# Patient Record
Sex: Male | Born: 1945 | Race: White | Hispanic: No | Marital: Married | Smoking: Former smoker
Health system: Southern US, Community
[De-identification: ages and names within clinical notes are randomized; demographics above are authoritative.]

## PROBLEM LIST (undated history)

## (undated) DIAGNOSIS — J42 Unspecified chronic bronchitis: Secondary | ICD-10-CM

## (undated) DIAGNOSIS — Z860101 Personal history of adenomatous and serrated colon polyps: Secondary | ICD-10-CM

## (undated) DIAGNOSIS — E7800 Pure hypercholesterolemia, unspecified: Secondary | ICD-10-CM

## (undated) DIAGNOSIS — I10 Essential (primary) hypertension: Secondary | ICD-10-CM

## (undated) DIAGNOSIS — R7303 Prediabetes: Secondary | ICD-10-CM

## (undated) HISTORY — PX: CARDIAC CATHETERIZATION: SHX172

---

## 2006-01-28 ENCOUNTER — Ambulatory Visit: Payer: Self-pay | Admitting: Unknown Physician Specialty

## 2006-05-13 ENCOUNTER — Ambulatory Visit: Payer: Self-pay | Admitting: Unknown Physician Specialty

## 2007-07-28 ENCOUNTER — Encounter: Payer: Self-pay | Admitting: Rheumatology

## 2007-08-04 ENCOUNTER — Encounter: Payer: Self-pay | Admitting: Rheumatology

## 2014-05-03 ENCOUNTER — Ambulatory Visit: Payer: Self-pay | Admitting: Unknown Physician Specialty

## 2014-05-06 LAB — PATHOLOGY REPORT

## 2018-08-11 ENCOUNTER — Ambulatory Visit: Payer: Medicare HMO | Admitting: Anesthesiology

## 2018-08-11 ENCOUNTER — Encounter: Payer: Self-pay | Admitting: *Deleted

## 2018-08-11 ENCOUNTER — Encounter
Admission: RE | Disposition: A | Discharge status: Home / Self Care | Payer: Self-pay | Source: Ambulatory Visit | Attending: Unknown Physician Specialty

## 2018-08-11 ENCOUNTER — Ambulatory Visit
Admission: RE | Admit: 2018-08-11 | Discharge: 2018-08-11 | Disposition: A | Discharge status: Home / Self Care | Payer: Medicare HMO | Source: Ambulatory Visit | Attending: Unknown Physician Specialty | Admitting: Unknown Physician Specialty

## 2018-08-11 DIAGNOSIS — Z86010 Personal history of colonic polyps: Secondary | ICD-10-CM | POA: Insufficient documentation

## 2018-08-11 DIAGNOSIS — Z85038 Personal history of other malignant neoplasm of large intestine: Secondary | ICD-10-CM | POA: Diagnosis not present

## 2018-08-11 DIAGNOSIS — K640 First degree hemorrhoids: Secondary | ICD-10-CM | POA: Insufficient documentation

## 2018-08-11 DIAGNOSIS — Z1211 Encounter for screening for malignant neoplasm of colon: Secondary | ICD-10-CM | POA: Diagnosis present

## 2018-08-11 DIAGNOSIS — Z87891 Personal history of nicotine dependence: Secondary | ICD-10-CM | POA: Insufficient documentation

## 2018-08-11 HISTORY — PX: COLONOSCOPY WITH PROPOFOL: SHX5780

## 2018-08-11 SURGERY — COLONOSCOPY WITH PROPOFOL
Anesthesia: General

## 2018-08-11 MED ORDER — FENTANYL CITRATE (PF) 100 MCG/2ML IJ SOLN
INTRAMUSCULAR | Status: DC | PRN
  Administered 2018-08-11 (×2): 50 ug via INTRAVENOUS

## 2018-08-11 MED ORDER — SODIUM CHLORIDE 0.9 % IV SOLN
INTRAVENOUS | Status: DC
  Administered 2018-08-11: 14:00:00 via INTRAVENOUS

## 2018-08-11 MED ORDER — SODIUM CHLORIDE 0.9 % IV SOLN: INTRAVENOUS | Status: DC

## 2018-08-11 MED ORDER — PROPOFOL 10 MG/ML IV BOLUS
INTRAVENOUS | Status: DC | PRN
  Administered 2018-08-11: 20 mg via INTRAVENOUS
  Administered 2018-08-11: 10 mg via INTRAVENOUS
  Administered 2018-08-11: 20 mg via INTRAVENOUS

## 2018-08-11 MED ORDER — MIDAZOLAM HCL 2 MG/2ML IJ SOLN
INTRAMUSCULAR | Status: DC | PRN
  Administered 2018-08-11 (×2): 1 mg via INTRAVENOUS

## 2018-08-11 MED ORDER — EPHEDRINE SULFATE 50 MG/ML IJ SOLN
INTRAMUSCULAR | Status: DC | PRN
  Administered 2018-08-11 (×2): 10 mg via INTRAVENOUS

## 2018-08-11 MED ORDER — FENTANYL CITRATE (PF) 100 MCG/2ML IJ SOLN
INTRAMUSCULAR | Status: AC
  Filled 2018-08-11: qty 2

## 2018-08-11 MED ORDER — LIDOCAINE HCL (CARDIAC) PF 100 MG/5ML IV SOSY
PREFILLED_SYRINGE | INTRAVENOUS | Status: DC | PRN
  Administered 2018-08-11: 80 mg via INTRATRACHEAL

## 2018-08-11 MED ORDER — LIDOCAINE HCL (PF) 2 % IJ SOLN
INTRAMUSCULAR | Status: AC
  Filled 2018-08-11: qty 10

## 2018-08-11 MED ORDER — PROPOFOL 500 MG/50ML IV EMUL
INTRAVENOUS | Status: DC | PRN
  Administered 2018-08-11: 50 ug/kg/min via INTRAVENOUS

## 2018-08-11 MED ORDER — MIDAZOLAM HCL 2 MG/2ML IJ SOLN
INTRAMUSCULAR | Status: AC
  Filled 2018-08-11: qty 2

## 2018-08-11 MED ORDER — PROPOFOL 10 MG/ML IV BOLUS
INTRAVENOUS | Status: AC
  Filled 2018-08-11: qty 20

## 2018-08-11 NOTE — Anesthesia Preprocedure Evaluation (Addendum)
Anesthesia Evaluation  Patient identified by MRN, date of birth, ID band Patient awake    Reviewed: Allergy & Precautions, H&P , NPO status , reviewed documented beta blocker date and time   Airway Mallampati: II  TM Distance: >3 FB Neck ROM: full    Dental  (+) Caps   Pulmonary former smoker,    Pulmonary exam normal        Cardiovascular Normal cardiovascular exam     Neuro/Psych    GI/Hepatic neg GERD  ,  Endo/Other    Renal/GU      Musculoskeletal   Abdominal   Peds  Hematology   Anesthesia Other Findings History reviewed. No pertinent past medical history.  Surgical history: Colonoscopy w hx polyps.    BMI    Body Mass Index:  23.01 kg/m      Reproductive/Obstetrics                            Anesthesia Physical Anesthesia Plan  ASA: I  Anesthesia Plan: General   Post-op Pain Management:    Induction: Intravenous  PONV Risk Score and Plan: 2 and Treatment may vary due to age or medical condition and TIVA  Airway Management Planned: Nasal Cannula and Natural Airway  Additional Equipment:   Intra-op Plan:   Post-operative Plan:   Informed Consent: I have reviewed the patients History and Physical, chart, labs and discussed the procedure including the risks, benefits and alternatives for the proposed anesthesia with the patient or authorized representative who has indicated his/her understanding and acceptance.   Dental Advisory Given  Plan Discussed with: CRNA  Anesthesia Plan Comments:        Anesthesia Quick Evaluation

## 2018-08-11 NOTE — Anesthesia Post-op Follow-up Note (Signed)
Anesthesia QCDR form completed.        

## 2018-08-11 NOTE — Op Note (Signed)
[facility] Gastroenterology Patient Name: Donald Barker Procedure Date: 08/11/2018 2:32 PM MRN: [id number] Account #: [id number] Date of Birth: 03/25/1946 Admit Type: Outpatient Age: 72 Room: [facility] ENDO ROOM 1 Gender: Male Note Status: Finalized Procedure:            Colonoscopy Indications:          High risk colon cancer surveillance: Personal history                        of colon cancer Providers:            Scot Jun, MD Referring MD:         No Local Md, MD (Referring MD) Medicines:            Propofol per Anesthesia Complications:        No immediate complications. Procedure:            Pre-Anesthesia Assessment:                       - After reviewing the risks and benefits, the patient                        was deemed in satisfactory condition to undergo the                        procedure.                       After obtaining informed consent, the colonoscope was                        passed under direct vision. Throughout the procedure,                        the patient's blood pressure, pulse, and oxygen                        saturations were monitored continuously. The                        Colonoscope was introduced through the anus and                        advanced to the the cecum,( picture not taken)                        identified by appendiceal orifice and ileocecal valve.                        The colonoscopy was performed without difficulty. The                        patient tolerated the procedure well. The quality of                        the bowel preparation was good. Findings:      Internal hemorrhoids were found during endoscopy. The hemorrhoids were       small and Grade I (internal hemorrhoids that do not prolapse).      The exam was otherwise without abnormality. Dye injections done from  previous procedure noted. No other abnormality seen. Impression:           - Internal hemorrhoids.   - The examination was otherwise normal.                       - No specimens collected. Recommendation:       - Repeat colonoscopy in 5 years for screening purposes. Scot Jun, MD 08/11/2018 4:24:53 PM This report has been signed electronically. Number of Addenda: 0 Note Initiated On: 08/11/2018 2:32 PM Scope Withdrawal Time: 0 hours 10 minutes 35 seconds  Total Procedure Duration: 0 hours 18 minutes 39 seconds       [facility]

## 2018-08-11 NOTE — Anesthesia Postprocedure Evaluation (Signed)
Anesthesia Post Note  Patient: Donald Barker.  Procedure(s) Performed: COLONOSCOPY WITH PROPOFOL (N/A )  Patient location during evaluation: Endoscopy Anesthesia Type: General Level of consciousness: awake and alert Pain management: pain level controlled Vital Signs Assessment: post-procedure vital signs reviewed and stable Respiratory status: spontaneous breathing, nonlabored ventilation, respiratory function stable and patient connected to nasal cannula oxygen Cardiovascular status: blood pressure returned to baseline and stable Postop Assessment: no apparent nausea or vomiting Anesthetic complications: no     Last Vitals:  Vitals:   08/11/18 1633 08/11/18 1643  BP: (!) 118/57 120/61  Pulse: 67 64  Resp: 16 17  Temp:    SpO2: 99% 98%    Last Pain:  Vitals:   08/11/18 1643  TempSrc:   PainSc: 0-No pain                 [name] [name]

## 2018-08-11 NOTE — H&P (Signed)
Primary Care Physician:  Lauro Regulus, MD Primary Gastroenterologist:  Dr. Mechele Collin  Pre-Procedure History & Physical: HPI:  Donald Barker. is a 72 y.o. male is here for an colonoscopy.Due to history of colon cancer in 2007.Patient had colon cancer inside of a polyp and It was removed.   History reviewed. No pertinent past medical history.  History reviewed. No pertinent surgical history.  Prior to Admission medications   Not on File    Allergies as of 05/20/2018  . (Not on File)    History reviewed. No pertinent family history.  Social History   Socioeconomic History  . Marital status: Married    Spouse name: Not on file  . Number of children: Not on file  . Years of education: Not on file  . Highest education level: Not on file  Occupational History  . Not on file  Social Needs  . Financial resource strain: Not on file  . Food insecurity:    Worry: Not on file    Inability: Not on file  . Transportation needs:    Medical: Not on file    Non-medical: Not on file  Tobacco Use  . Smoking status: Former Smoker    Last attempt to quit: 08/11/1984    Years since quitting: 34.0  . Smokeless tobacco: Never Used  Substance and Sexual Activity  . Alcohol use: Yes    Alcohol/week: 2.0 standard drinks    Types: 1 Cans of beer, 1 Glasses of wine per week  . Drug use: Never  . Sexual activity: Not on file  Lifestyle  . Physical activity:    Days per week: Not on file    Minutes per session: Not on file  . Stress: Not on file  Relationships  . Social connections:    Talks on phone: Not on file    Gets together: Not on file    Attends religious service: Not on file    Active member of club or organization: Not on file    Attends meetings of clubs or organizations: Not on file    Relationship status: Not on file  . Intimate partner violence:    Fear of current or ex partner: Not on file    Emotionally abused: Not on file    Physically abused: Not on file     Forced sexual activity: Not on file  Other Topics Concern  . Not on file  Social History Narrative  . Not on file    Review of Systems: See HPI, otherwise negative ROS  Physical Exam: BP 139/82   Pulse 73   Temp (!) 96.6 F (35.9 C) (Tympanic)   Resp 16   Ht 5\' 11"  (1.803 m)   Wt 74.8 kg   SpO2 99%   BMI 23.01 kg/m  General:   Alert,  pleasant and cooperative in NAD Head:  Normocephalic and atraumatic. Neck:  Supple; no masses or thyromegaly. Lungs:  Clear throughout to auscultation.    Heart:  Regular rate and rhythm. Abdomen:  Soft, nontender and nondistended. Normal bowel sounds, without guarding, and without rebound.   Neurologic:  Alert and  oriented x4;  grossly normal neurologically.  Impression/Plan: [name]. is here for an colonoscopy to be performed for [facility] colon cancer and colon polyps..  Risks, benefits, limitations, and alternatives regarding  colonoscopy have been reviewed with the patient.  Questions have been answered.  All parties agreeable.   [name], MD  08/11/2018, 2:18 PM

## 2018-08-11 NOTE — Transfer of Care (Signed)
Immediate Anesthesia Transfer of Care Note  Patient: Donald Barker.  Procedure(s) Performed: COLONOSCOPY WITH PROPOFOL (N/A )  Patient Location: PACU  Anesthesia Type:MAC  Level of Consciousness: awake and alert   Airway & Oxygen Therapy: Patient Spontanous Breathing and Patient connected to nasal cannula oxygen  Post-op Assessment: Report given to RN and Post -op Vital signs reviewed and stable  Post vital signs: Reviewed and stable  Last Vitals:  Vitals Value Taken Time  BP    Temp    Pulse    Resp    SpO2      Last Pain:  Vitals:   08/11/18 1409  TempSrc: Tympanic  PainSc: 0-No pain         Complications: No apparent anesthesia complications

## 2018-08-12 ENCOUNTER — Encounter: Payer: Self-pay | Admitting: Unknown Physician Specialty

## 2020-01-05 ENCOUNTER — Ambulatory Visit: Payer: Medicare Other | Attending: Internal Medicine

## 2020-01-05 DIAGNOSIS — Z20822 Contact with and (suspected) exposure to covid-19: Secondary | ICD-10-CM

## 2020-01-06 ENCOUNTER — Other Ambulatory Visit: Payer: Self-pay | Admitting: Internal Medicine

## 2020-01-06 DIAGNOSIS — U071 COVID-19: Secondary | ICD-10-CM

## 2020-01-06 LAB — NOVEL CORONAVIRUS, NAA: SARS-CoV-2, NAA: DETECTED — AB

## 2020-01-06 NOTE — Progress Notes (Signed)
  I connected by phone with Donald Barker. on 01/06/2020 at 3:28 PM to discuss the potential use of an new treatment for mild to moderate COVID-19 viral infection in non-hospitalized patients.  This patient is a 74 y.o. male that meets the FDA criteria for Emergency Use Authorization of bamlanivimab or casirivimab\imdevimab.  Has a (+) direct SARS-CoV-2 viral test result  Has mild or moderate COVID-19   Is ? 74 years of age and weighs ? 40 kg  Is NOT hospitalized due to COVID-19  Is NOT requiring oxygen therapy or requiring an increase in baseline oxygen flow rate due to COVID-19  Is within 10 days of symptom onset  Has at least one of the high risk factor(s) for progression to severe COVID-19 and/or hospitalization as defined in EUA.  Specific high risk criteria : >/= 74 yo   I have spoken and communicated the following to the patient or parent/caregiver:  1. FDA has authorized the emergency use of bamlanivimab and casirivimab\imdevimab for the treatment of mild to moderate COVID-19 in adults and pediatric patients with positive results of direct SARS-CoV-2 viral testing who are 43 years of age and older weighing at least 40 kg, and who are at high risk for progressing to severe COVID-19 and/or hospitalization.  2. The significant known and potential risks and benefits of bamlanivimab and casirivimab\imdevimab, and the extent to which such potential risks and benefits are unknown.  3. Information on available alternative treatments and the risks and benefits of those alternatives, including clinical trials.  4. Patients treated with bamlanivimab and casirivimab\imdevimab should continue to self-isolate and use infection control measures (e.g., wear mask, isolate, social distance, avoid sharing personal items, clean and disinfect "high touch" surfaces, and frequent handwashing) according to CDC guidelines.   5. The patient or parent/caregiver has the option to accept or refuse  bamlanivimab or casirivimab\imdevimab .  After reviewing this information with the patient, The patient agreed to proceed with receiving the bamlanimivab infusion and will be provided a copy of the Fact sheet prior to receiving the infusion.   Appointment for infusion scheduled for 2/4 at 10:30am.  Cyndee Brightly, NP-C [facility]  pgr [phone]

## 2020-01-07 ENCOUNTER — Telehealth (HOSPITAL_COMMUNITY): Payer: Self-pay | Admitting: Nurse Practitioner

## 2020-01-07 ENCOUNTER — Ambulatory Visit (HOSPITAL_COMMUNITY)
Admission: RE | Admit: 2020-01-07 | Discharge: 2020-01-07 | Disposition: A | Discharge status: Home / Self Care | Payer: Medicare Other | Source: Ambulatory Visit | Attending: Pulmonary Disease | Admitting: Pulmonary Disease

## 2020-01-07 ENCOUNTER — Encounter (HOSPITAL_COMMUNITY): Payer: Self-pay

## 2020-01-07 DIAGNOSIS — U071 COVID-19: Secondary | ICD-10-CM | POA: Diagnosis present

## 2020-01-07 DIAGNOSIS — Z23 Encounter for immunization: Secondary | ICD-10-CM | POA: Diagnosis not present

## 2020-01-07 MED ORDER — EPINEPHRINE 0.3 MG/0.3ML IJ SOAJ: 0.3000 mg | Freq: Once | INTRAMUSCULAR | Status: DC | PRN

## 2020-01-07 MED ORDER — SODIUM CHLORIDE 0.9 % IV SOLN
700.0000 mg | Freq: Once | INTRAVENOUS | Status: AC
  Administered 2020-01-07: 700 mg via INTRAVENOUS
  Filled 2020-01-07: qty 20

## 2020-01-07 MED ORDER — DIPHENHYDRAMINE HCL 50 MG/ML IJ SOLN: 50.0000 mg | Freq: Once | INTRAMUSCULAR | Status: DC | PRN

## 2020-01-07 MED ORDER — SODIUM CHLORIDE 0.9 % IV SOLN: INTRAVENOUS | Status: DC | PRN

## 2020-01-07 MED ORDER — ALBUTEROL SULFATE HFA 108 (90 BASE) MCG/ACT IN AERS: 2.0000 | INHALATION_SPRAY | Freq: Once | RESPIRATORY_TRACT | Status: DC | PRN

## 2020-01-07 MED ORDER — METHYLPREDNISOLONE SODIUM SUCC 125 MG IJ SOLR: 125.0000 mg | Freq: Once | INTRAMUSCULAR | Status: DC | PRN

## 2020-01-07 MED ORDER — FAMOTIDINE IN NACL 20-0.9 MG/50ML-% IV SOLN: 20.0000 mg | Freq: Once | INTRAVENOUS | Status: DC | PRN

## 2020-01-07 NOTE — Progress Notes (Signed)
  Diagnosis: COVID-19  Physician:Dr Wright  Procedure: [facility] Med: bamlanivimab infusion - Provided patient with bamlanimivab fact sheet for patients, parents and caregivers prior to infusion.  Complications: No immediate complications noted.  Discharge: Discharged home   Deidrea Gaetz W 01/07/2020  

## 2020-01-07 NOTE — Discharge Instructions (Signed)

## 2020-01-07 NOTE — Telephone Encounter (Signed)
Called patient to discuss MAB treatment based on covid positive test. He had just received infusion and is feeling well.

## 2020-06-07 ENCOUNTER — Other Ambulatory Visit: Payer: Self-pay | Admitting: Sports Medicine

## 2020-06-07 DIAGNOSIS — M25561 Pain in right knee: Secondary | ICD-10-CM

## 2020-06-07 DIAGNOSIS — M25461 Effusion, right knee: Secondary | ICD-10-CM

## 2020-06-21 ENCOUNTER — Ambulatory Visit
Admission: RE | Admit: 2020-06-21 | Discharge: 2020-06-21 | Disposition: A | Discharge status: Home / Self Care | Payer: Medicare HMO | Source: Ambulatory Visit | Attending: Sports Medicine | Admitting: Sports Medicine

## 2020-06-21 ENCOUNTER — Other Ambulatory Visit: Payer: Self-pay

## 2020-06-21 DIAGNOSIS — M25461 Effusion, right knee: Secondary | ICD-10-CM | POA: Insufficient documentation

## 2020-06-21 DIAGNOSIS — M25561 Pain in right knee: Secondary | ICD-10-CM | POA: Diagnosis present

## 2020-07-04 ENCOUNTER — Other Ambulatory Visit: Payer: Self-pay | Admitting: Orthopedic Surgery

## 2020-07-11 ENCOUNTER — Other Ambulatory Visit: Payer: Self-pay

## 2020-07-11 ENCOUNTER — Encounter
Admission: RE | Admit: 2020-07-11 | Discharge: 2020-07-11 | Disposition: A | Discharge status: Home / Self Care | Payer: Medicare HMO | Source: Ambulatory Visit | Attending: Orthopedic Surgery | Admitting: Orthopedic Surgery

## 2020-07-11 NOTE — Patient Instructions (Addendum)
Your procedure is scheduled on: 07/15/20 Report to [place]. To find out your arrival time please call [phone] between 1PM - 3PM on 07/14/20.  Remember: Instructions that are not followed completely may result in serious medical risk, up to and including death, or upon the discretion of your surgeon and anesthesiologist your surgery may need to be rescheduled.     _X__ 1. Do not eat food after midnight the night before your procedure.                 No gum chewing or hard candies. You may drink clear liquids up to 2 hours                 before you are scheduled to arrive for your surgery- DO not drink clear                 liquids within 2 hours of the start of your surgery.                 Clear Liquids include:  water, apple juice without pulp, clear carbohydrate                 drink such as Clearfast or Gatorade, Black Coffee or Tea (Do not add                 anything to coffee or tea). Diabetics water only  __X__2.  On the morning of surgery brush your teeth with toothpaste and water, you                 may rinse your mouth with mouthwash if you wish.  Do not swallow any              toothpaste of mouthwash.     _X__ 3.  No Alcohol for 24 hours before or after surgery.   _X__ 4.  Do Not Smoke or use e-cigarettes For 24 Hours Prior to Your Surgery.                 Do not use any chewable tobacco products for at least 6 hours prior to                 surgery.  ____  5.  Bring all medications with you on the day of surgery if instructed.   __X__  6.  Notify your doctor if there is any change in your medical condition      (cold, fever, infections).     Do not wear jewelry, make-up, hairpins, clips or nail polish. Do not wear lotions, powders, or perfumes.  Do not shave 48 hours prior to surgery. Men may shave face and neck. Do not bring valuables to the hospital.    [facility] is not responsible for any belongings or  valuables.  Contacts, dentures/partials or body piercings may not be worn into surgery. Bring a case for your contacts, glasses or hearing aids, a denture cup will be supplied. Leave your suitcase in the car. After surgery it may be brought to your room. For patients admitted to the hospital, discharge time is determined by your treatment team.   Patients discharged the day of surgery will not be allowed to drive home.   Please read over the following fact sheets that you were given:   MRSA Information  __X__ Take these medicines the morning of surgery with A SIP OF WATER:  1. none  2.   3.   4.  5.  6.  ____ Fleet Enema (as directed)   __X__ Use CHG Soap/SAGE wipes as directed  ____ Use inhalers on the day of surgery  ____ Stop metformin/Janumet/Farxiga 2 days prior to surgery    ____ Take 1/2 of usual insulin dose the night before surgery. No insulin the morning          of surgery.   ____ Stop Blood Thinners Coumadin/Plavix/Xarelto/Pleta/Pradaxa/Eliquis/Effient/Aspirin  on   Or contact your Surgeon, Cardiologist or Medical Doctor regarding  ability to stop your blood thinners  __X__ Stop Anti-inflammatories 7 days before surgery such as Advil, Ibuprofen, Motrin,  BC or Goodies Powder, Naprosyn, Naproxen, Aleve, Aspirin    __X__ Stop all herbal supplements, fish oil or vitamin E until after surgery.    ____ Bring C-Pap to the hospital.    Ensure pre-surgery drink to be finished 2 hours before arrival.   How to Use an Incentive Spirometer An incentive spirometer is a tool that measures how well you are filling your lungs with each breath. Learning to take long, deep breaths using this tool can help you keep your lungs clear and active. This may help to reverse or lessen your chance of developing breathing (pulmonary) problems, especially infection. You may be asked to use a spirometer:  After a surgery.  If you have a lung problem or a history of smoking.  After  a long period of time when you have been unable to move or be active. If the spirometer includes an indicator to show the highest number that you have reached, your health care provider or respiratory therapist will help you set a goal. Keep a list (log) of your progress as told by your health care provider. What are the risks?  Breathing too quickly may cause dizziness or cause you to pass out. Take your time so you do not get dizzy or light-headed.  If you are in pain, you may need to take pain medicine before doing incentive spirometry. It is harder to take a deep breath if you are having pain. How to use your incentive spirometer  1. Sit up on the edge of your bed or on a chair. 2. Hold the incentive spirometer so that it is in an upright position. 3. Before you use the spirometer, breathe out normally. 4. Place the mouthpiece in your mouth. Make sure your lips are closed tightly around it. 5. Breathe in slowly and as deeply as you can through your mouth, causing the piston or the ball to rise toward the top of the chamber. 6. Hold your breath for 3-5 seconds, or for as long as possible. ? If the spirometer includes a coach indicator, use this to guide you in breathing. Slow down your breathing if the indicator goes above the marked areas. 7. Remove the mouthpiece from your mouth and breathe out normally. The piston or ball will return to the bottom of the chamber. 8. Rest for a few seconds, then repeat the steps 10 or more times. ? Take your time and take a few normal breaths between deep breaths so that you do not get dizzy or light-headed. ? Do this every 1-2 hours when you are awake. 9. If the spirometer includes a goal marker to show the highest number you have reached (best effort), use this as a goal to work toward during each repetition. 10. After each set of 10 deep breaths, cough a few times.  This will help to make sure that your lungs are clear. ? If you have an incision on your  chest or abdomen from surgery, place a pillow or a rolled-up towel firmly against the incision when you cough. This can help to reduce pain from coughing. General tips  When you become able to get out of bed, walk around often and continue to cough to help clear your lungs.  Keep using the incentive spirometer until your health care provider says it is okay to stop using it. If you have been in the hospital, you may be told to keep using the spirometer at home. Contact a health care provider if:  You are having difficulty using the spirometer.  You have trouble using the spirometer as often as instructed.  Your pain medicine is not giving enough relief for you to use the spirometer as told.  You have a fever.  You develop shortness of breath. Get help right away if:  You develop a cough with bloody mucus from the lungs (bloody sputum).  You have fluid or blood coming from an incision site after you cough. Summary  An incentive spirometer is a tool that can help you learn to take long, deep breaths to keep your lungs clear and active.  You may be asked to use a spirometer after a surgery, if you have a lung problem or a history of smoking, or if you have been inactive for a long period of time.  Use your incentive spirometer as instructed every 1-2 hours while you are awake.  If you have an incision on your chest or abdomen, place a pillow or a rolled-up towel firmly against your incision when you cough. This will help to reduce pain. This information is not intended to replace advice given to you by your health care provider. Make sure you discuss any questions you have with your health care provider. Document Revised: 06/19/2019 Document Reviewed: 10/02/2017 Elsevier Patient Education  2020 [employer].

## 2020-07-13 ENCOUNTER — Other Ambulatory Visit
Admission: RE | Admit: 2020-07-13 | Discharge: 2020-07-13 | Disposition: A | Discharge status: Home / Self Care | Payer: Medicare HMO | Source: Ambulatory Visit | Attending: Orthopedic Surgery | Admitting: Orthopedic Surgery

## 2020-07-13 ENCOUNTER — Other Ambulatory Visit: Payer: Self-pay

## 2020-07-13 ENCOUNTER — Other Ambulatory Visit: Payer: Medicare HMO

## 2020-07-13 DIAGNOSIS — Z20822 Contact with and (suspected) exposure to covid-19: Secondary | ICD-10-CM | POA: Insufficient documentation

## 2020-07-13 DIAGNOSIS — Z01818 Encounter for other preprocedural examination: Secondary | ICD-10-CM | POA: Diagnosis not present

## 2020-07-13 DIAGNOSIS — Z01810 Encounter for preprocedural cardiovascular examination: Secondary | ICD-10-CM

## 2020-07-13 LAB — SARS CORONAVIRUS 2 (TAT 6-24 HRS): SARS Coronavirus 2: NEGATIVE

## 2020-07-15 ENCOUNTER — Other Ambulatory Visit: Payer: Self-pay

## 2020-07-15 ENCOUNTER — Ambulatory Visit: Payer: Medicare HMO | Admitting: Anesthesiology

## 2020-07-15 ENCOUNTER — Ambulatory Visit
Admission: RE | Admit: 2020-07-15 | Discharge: 2020-07-15 | Disposition: A | Discharge status: Home / Self Care | Payer: Medicare HMO | Attending: Orthopedic Surgery | Admitting: Orthopedic Surgery

## 2020-07-15 ENCOUNTER — Encounter
Admission: RE | Disposition: A | Discharge status: Home / Self Care | Payer: Self-pay | Source: Home / Self Care | Attending: Orthopedic Surgery

## 2020-07-15 ENCOUNTER — Encounter: Payer: Self-pay | Admitting: Orthopedic Surgery

## 2020-07-15 DIAGNOSIS — Z85038 Personal history of other malignant neoplasm of large intestine: Secondary | ICD-10-CM | POA: Insufficient documentation

## 2020-07-15 DIAGNOSIS — E7800 Pure hypercholesterolemia, unspecified: Secondary | ICD-10-CM | POA: Insufficient documentation

## 2020-07-15 DIAGNOSIS — Z87891 Personal history of nicotine dependence: Secondary | ICD-10-CM | POA: Diagnosis not present

## 2020-07-15 DIAGNOSIS — Z86010 Personal history of colonic polyps: Secondary | ICD-10-CM | POA: Insufficient documentation

## 2020-07-15 DIAGNOSIS — X58XXXA Exposure to other specified factors, initial encounter: Secondary | ICD-10-CM | POA: Diagnosis not present

## 2020-07-15 DIAGNOSIS — S83241A Other tear of medial meniscus, current injury, right knee, initial encounter: Secondary | ICD-10-CM | POA: Diagnosis not present

## 2020-07-15 HISTORY — PX: KNEE ARTHROSCOPY WITH MEDIAL MENISECTOMY: SHX5651

## 2020-07-15 SURGERY — ARTHROSCOPY, KNEE, WITH MEDIAL MENISCECTOMY
Anesthesia: General | Site: Knee | Laterality: Right

## 2020-07-15 MED ORDER — LACTATED RINGERS IV SOLN: INTRAVENOUS | Status: DC

## 2020-07-15 MED ORDER — OXYCODONE HCL 5 MG PO TABS: 5.0000 mg | ORAL_TABLET | Freq: Once | ORAL | Status: DC | PRN

## 2020-07-15 MED ORDER — FAMOTIDINE 20 MG PO TABS: 20.0000 mg | ORAL_TABLET | Freq: Once | ORAL | Status: AC

## 2020-07-15 MED ORDER — CHLORHEXIDINE GLUCONATE 0.12 % MT SOLN: 15.0000 mL | Freq: Once | OROMUCOSAL | Status: AC

## 2020-07-15 MED ORDER — CEFAZOLIN SODIUM-DEXTROSE 2-4 GM/100ML-% IV SOLN
INTRAVENOUS | Status: AC
  Filled 2020-07-15: qty 100

## 2020-07-15 MED ORDER — LIDOCAINE-EPINEPHRINE 1 %-1:100000 IJ SOLN
INTRAMUSCULAR | Status: DC | PRN
  Administered 2020-07-15: 6 mL

## 2020-07-15 MED ORDER — ORAL CARE MOUTH RINSE: 15.0000 mL | Freq: Once | OROMUCOSAL | Status: AC

## 2020-07-15 MED ORDER — FAMOTIDINE 20 MG PO TABS
ORAL_TABLET | ORAL | Status: AC
  Administered 2020-07-15: 20 mg via ORAL
  Filled 2020-07-15: qty 1

## 2020-07-15 MED ORDER — PROPOFOL 10 MG/ML IV BOLUS
INTRAVENOUS | Status: DC | PRN
  Administered 2020-07-15: 150 mg via INTRAVENOUS

## 2020-07-15 MED ORDER — CEFAZOLIN SODIUM-DEXTROSE 2-4 GM/100ML-% IV SOLN
2.0000 g | INTRAVENOUS | Status: AC
  Administered 2020-07-15: 2 g via INTRAVENOUS

## 2020-07-15 MED ORDER — LACTATED RINGERS IV SOLN
INTRAVENOUS | Status: DC | PRN
  Administered 2020-07-15: 4 mL

## 2020-07-15 MED ORDER — DEXAMETHASONE SODIUM PHOSPHATE 10 MG/ML IJ SOLN
INTRAMUSCULAR | Status: DC | PRN
  Administered 2020-07-15: 5 mg via INTRAVENOUS

## 2020-07-15 MED ORDER — FENTANYL CITRATE (PF) 100 MCG/2ML IJ SOLN
INTRAMUSCULAR | Status: AC
  Filled 2020-07-15: qty 2

## 2020-07-15 MED ORDER — EPINEPHRINE PF 1 MG/ML IJ SOLN
INTRAMUSCULAR | Status: AC
  Filled 2020-07-15: qty 4

## 2020-07-15 MED ORDER — ONDANSETRON 4 MG PO TBDP
4.0000 mg | ORAL_TABLET | Freq: Three times a day (TID) | ORAL | 0 refills | Status: DC | PRN
Start: 2020-07-15 — End: 2020-12-30

## 2020-07-15 MED ORDER — PROPOFOL 10 MG/ML IV BOLUS
INTRAVENOUS | Status: AC
  Filled 2020-07-15: qty 20

## 2020-07-15 MED ORDER — BUPIVACAINE HCL (PF) 0.5 % IJ SOLN
INTRAMUSCULAR | Status: DC | PRN
  Administered 2020-07-15: 6 mL

## 2020-07-15 MED ORDER — ONDANSETRON HCL 4 MG/2ML IJ SOLN
INTRAMUSCULAR | Status: DC | PRN
  Administered 2020-07-15: 4 mg via INTRAVENOUS

## 2020-07-15 MED ORDER — FENTANYL CITRATE (PF) 100 MCG/2ML IJ SOLN
INTRAMUSCULAR | Status: DC | PRN
  Administered 2020-07-15 (×2): 25 ug via INTRAVENOUS

## 2020-07-15 MED ORDER — ACETAMINOPHEN 500 MG PO TABS
1000.0000 mg | ORAL_TABLET | Freq: Three times a day (TID) | ORAL | 2 refills | Status: DC
Start: 2020-07-15 — End: 2020-12-30

## 2020-07-15 MED ORDER — CHLORHEXIDINE GLUCONATE 0.12 % MT SOLN
OROMUCOSAL | Status: AC
  Administered 2020-07-15: 15 mL via OROMUCOSAL
  Filled 2020-07-15: qty 15

## 2020-07-15 MED ORDER — IBUPROFEN 800 MG PO TABS
800.0000 mg | ORAL_TABLET | Freq: Three times a day (TID) | ORAL | 1 refills | Status: AC
Start: 2020-07-15 — End: 2020-07-29

## 2020-07-15 MED ORDER — HYDROCODONE-ACETAMINOPHEN 5-325 MG PO TABS: 1.0000 | ORAL_TABLET | ORAL | 0 refills | Status: DC | PRN

## 2020-07-15 MED ORDER — FENTANYL CITRATE (PF) 100 MCG/2ML IJ SOLN: 25.0000 ug | INTRAMUSCULAR | Status: DC | PRN

## 2020-07-15 MED ORDER — ASPIRIN EC 325 MG PO TBEC
325.0000 mg | DELAYED_RELEASE_TABLET | Freq: Every day | ORAL | 0 refills | Status: AC
Start: 2020-07-15 — End: 2020-07-29

## 2020-07-15 MED ORDER — OXYCODONE HCL 5 MG/5ML PO SOLN: 5.0000 mg | Freq: Once | ORAL | Status: DC | PRN

## 2020-07-15 SURGICAL SUPPLY — 48 items
ADAPTER IRRIG TUBE 2 SPIKE SOL (ADAPTER) ×6 IMPLANT
ADPR TBG 2 SPK PMP STRL ASCP (ADAPTER) ×2
APL PRP STRL LF DISP 70% ISPRP (MISCELLANEOUS) ×1
BLADE SURG SZ11 CARB STEEL (BLADE) ×3 IMPLANT
BNDG COHESIVE 6X5 TAN STRL LF (GAUZE/BANDAGES/DRESSINGS) ×3 IMPLANT
BNDG ELASTIC 6X5.8 VLCR STR LF (GAUZE/BANDAGES/DRESSINGS) ×3 IMPLANT
BNDG ESMARK 6X12 TAN STRL LF (GAUZE/BANDAGES/DRESSINGS) ×3 IMPLANT
BUR RADIUS 3.5 (BURR) ×3 IMPLANT
BUR RADIUS 4.0X18.5 (BURR) ×3 IMPLANT
CAST PADDING 6X4YD ST 30248 (SOFTGOODS) ×2
CHLORAPREP W/TINT 26 (MISCELLANEOUS) ×3 IMPLANT
COOLER POLAR GLACIER W/PUMP (MISCELLANEOUS) ×3 IMPLANT
COVER WAND RF STERILE (DRAPES) ×3 IMPLANT
CUFF TOURN SGL QUICK 24 (TOURNIQUET CUFF)
CUFF TOURN SGL QUICK 30 (TOURNIQUET CUFF) ×3
CUFF TRNQT CYL 24X4X16.5-23 (TOURNIQUET CUFF) IMPLANT
CUFF TRNQT CYL 30X4X21-28X (TOURNIQUET CUFF) ×1 IMPLANT
DEVICE SUCT BLK HOLE OR FLOOR (MISCELLANEOUS) IMPLANT
DRAPE IMP U-DRAPE 54X76 (DRAPES) ×3 IMPLANT
ELECT REM PT RETURN 9FT ADLT (ELECTROSURGICAL)
ELECTRODE REM PT RTRN 9FT ADLT (ELECTROSURGICAL) IMPLANT
GAUZE SPONGE 4X4 12PLY STRL (GAUZE/BANDAGES/DRESSINGS) ×3 IMPLANT
GLOVE BIOGEL PI IND STRL 8 (GLOVE) ×1 IMPLANT
GLOVE BIOGEL PI INDICATOR 8 (GLOVE) ×2
GLOVE SURG ORTHO 8.0 STRL STRW (GLOVE) ×6 IMPLANT
GOWN STRL REUS W/ TWL LRG LVL3 (GOWN DISPOSABLE) ×1 IMPLANT
GOWN STRL REUS W/ TWL XL LVL3 (GOWN DISPOSABLE) ×1 IMPLANT
GOWN STRL REUS W/TWL LRG LVL3 (GOWN DISPOSABLE) ×3
GOWN STRL REUS W/TWL XL LVL3 (GOWN DISPOSABLE) ×3
IV LACTATED RINGER IRRG 3000ML (IV SOLUTION) ×12
IV LR IRRIG 3000ML ARTHROMATIC (IV SOLUTION) ×4 IMPLANT
KIT TURNOVER KIT A (KITS) ×3 IMPLANT
MANIFOLD NEPTUNE II (INSTRUMENTS) ×3 IMPLANT
MAT ABSORB  FLUID 56X50 GRAY (MISCELLANEOUS) ×2
MAT ABSORB FLUID 56X50 GRAY (MISCELLANEOUS) ×1 IMPLANT
PACK KNEE ARTHRO (MISCELLANEOUS) ×3 IMPLANT
PAD ABD DERMACEA PRESS 5X9 (GAUZE/BANDAGES/DRESSINGS) ×6 IMPLANT
PAD WRAPON POLAR KNEE (MISCELLANEOUS) ×1 IMPLANT
PADDING CAST COTTON 6X4 ST (SOFTGOODS) ×1 IMPLANT
PENCIL ELECTRO HAND CTR (MISCELLANEOUS) IMPLANT
SET TUBE SUCT SHAVER OUTFL 24K (TUBING) ×3 IMPLANT
SET TUBE TIP INTRA-ARTICULAR (MISCELLANEOUS) ×3 IMPLANT
SUT ETHILON 3-0 FS-10 30 BLK (SUTURE) ×3
SUTURE EHLN 3-0 FS-10 30 BLK (SUTURE) ×1 IMPLANT
TOWEL OR 17X26 4PK STRL BLUE (TOWEL DISPOSABLE) ×6 IMPLANT
TUBING ARTHRO INFLOW-ONLY STRL (TUBING) ×3 IMPLANT
WAND WEREWOLF FLOW 90D (MISCELLANEOUS) ×3 IMPLANT
WRAPON POLAR PAD KNEE (MISCELLANEOUS) ×3

## 2020-07-15 NOTE — Transfer of Care (Signed)
Immediate Anesthesia Transfer of Care Note  Patient: Donald Barker.  Procedure(s) Performed: Right knee arthroscopic partial medial meniscectomy (Right Knee)  Patient Location: PACU  Anesthesia Type:General  Level of Consciousness: awake, alert  and oriented  Airway & Oxygen Therapy: Patient Spontanous Breathing and Patient connected to face mask oxygen  Post-op Assessment: Report given to RN and Post -op Vital signs reviewed and stable  Post vital signs: Reviewed and stable  Last Vitals:  Vitals Value Taken Time  BP 124/70 07/15/20 1226  Temp 35.9 C 07/15/20 1226  Pulse 56 07/15/20 1231  Resp 13 07/15/20 1231  SpO2 100 % 07/15/20 1231  Vitals shown include unvalidated device data.  Last Pain:  Vitals:   07/15/20 1226  TempSrc:   PainSc: Asleep         Complications: No complications documented.

## 2020-07-15 NOTE — H&P (Signed)
Paper H&P to be scanned into permanent record. H&P reviewed. No significant changes noted.  

## 2020-07-15 NOTE — Anesthesia Preprocedure Evaluation (Addendum)
Anesthesia Evaluation  Patient identified by MRN, date of birth, ID band Patient awake    Reviewed: Allergy & Precautions, H&P , NPO status , Patient's Chart, lab work & pertinent test results  History of Anesthesia Complications Negative for: history of anesthetic complications  Airway Mallampati: III  TM Distance: <3 FB Neck ROM: limited    Dental  (+) Chipped   Pulmonary neg shortness of breath, former smoker,    Pulmonary exam normal        Cardiovascular Exercise Tolerance: Good (-) angina(-) Past MI and (-) DOE negative cardio ROS Normal cardiovascular exam     Neuro/Psych negative neurological ROS  negative psych ROS   GI/Hepatic Neg liver ROS, neg GERD  ,  Endo/Other  negative endocrine ROS  Renal/GU      Musculoskeletal   Abdominal   Peds  Hematology negative hematology ROS (+)   Anesthesia Other Findings  Past Surgical History: 08/11/2018: COLONOSCOPY WITH PROPOFOL; N/A     Comment:  Procedure: COLONOSCOPY WITH PROPOFOL;  Surgeon: Scot Jun, MD;  Location: [facility] ENDOSCOPY;  Service:               Endoscopy;  Laterality: N/A;     Reproductive/Obstetrics negative OB ROS                            Anesthesia Physical Anesthesia Plan  ASA: I  Anesthesia Plan: General LMA   Post-op Pain Management:    Induction: Intravenous  PONV Risk Score and Plan: Dexamethasone, Ondansetron, Midazolam and Treatment may vary due to age or medical condition  Airway Management Planned: LMA  Additional Equipment:   Intra-op Plan:   Post-operative Plan: Extubation in OR  Informed Consent: I have reviewed the patients History and Physical, chart, labs and discussed the procedure including the risks, benefits and alternatives for the proposed anesthesia with the patient or authorized representative who has indicated his/her understanding and acceptance.      Dental Advisory Given  Plan Discussed with: Anesthesiologist, CRNA and Surgeon  Anesthesia Plan Comments: (Patient consented for risks of anesthesia including but not limited to:  - adverse reactions to medications - damage to eyes, teeth, lips or other oral mucosa - nerve damage due to positioning  - sore throat or hoarseness - Damage to heart, brain, nerves, lungs, other parts of body or loss of life  Patient voiced understanding.)        Anesthesia Quick Evaluation

## 2020-07-15 NOTE — Anesthesia Postprocedure Evaluation (Signed)
Anesthesia Post Note  Patient: Donald Barker.  Procedure(s) Performed: Right knee arthroscopic partial medial meniscectomy (Right Knee)  Patient location during evaluation: PACU Anesthesia Type: General Level of consciousness: awake and alert Pain management: pain level controlled Vital Signs Assessment: post-procedure vital signs reviewed and stable Respiratory status: spontaneous breathing, nonlabored ventilation, respiratory function stable and patient connected to nasal cannula oxygen Cardiovascular status: blood pressure returned to baseline and stable Postop Assessment: no apparent nausea or vomiting Anesthetic complications: no   No complications documented.   Last Vitals:  Vitals:   07/15/20 1256 07/15/20 1307  BP:  135/72  Pulse: (!) 56 66  Resp: (!) 21 20  Temp: (!) 36.1 C (!) 36.1 C  SpO2: 94% 99%    Last Pain:  Vitals:   07/15/20 1307  TempSrc: Temporal  PainSc: 2                  [name] Ricky Gallery

## 2020-07-15 NOTE — Op Note (Signed)
Operative Note    SURGERY DATE: 07/15/2020   PRE-OP DIAGNOSIS:  1. Right medial meniscus tear 2. Right tricompartmental degenerative changes   POST-OP DIAGNOSIS:  1. Right medial meniscus tear 2. Right tricompartmental degenerative changes   PROCEDURES:  1. Right knee arthroscopy, partial medial meniscectomy 2. Right knee chondroplasty of medial compartment   SURGEON: Rosealee Albee, MD   ANESTHESIA: Gen   ESTIMATED BLOOD LOSS: minimal   TOTAL IV FLUIDS: per anesthesia   INDICATION(S):  Donald Barker. is a 74 y.o. male with signs and symptoms as well as MRI finding of medial meniscus tear.  He underwent a course of medical management as well as ultrasound-guided corticosteroid injection without appropriate relief of his symptoms. After discussion of risks, benefits, and alternatives to surgery, the patient elected to proceed.   OPERATIVE FINDINGS:    Examination under anesthesia: A careful examination under anesthesia was performed.  Passive range of motion was: Hyperextension: 1.  Extension: 0.  Flexion: 130.  Lachman: normal. Pivot Shift: normal.  Posterior drawer: normal.  Varus stability in full extension: normal.  Varus stability in 30 degrees of flexion: normal.  Valgus stability in full extension: normal.  Valgus stability in 30 degrees of flexion: normal.   Intra-operative findings: A thorough arthroscopic examination of the knee was performed.  The findings are: 1. Suprapatellar pouch: Normal 2. Undersurface of median ridge: Grade 2 degenerative changes 3. Medial patellar facet: Grade 1-2 degenerative changes 4. Lateral patellar facet: Grade 1-2 degenerative changes 5. Trochlea: Grade 1 degenerative changes 6. Lateral gutter/popliteus tendon: Normal 7. Hoffa's fat pad: Inflamed 8. Medial gutter/plica: Normal 9. ACL: Normal 10. PCL: Normal 11. Medial meniscus: Complex tear with radial tear affecting ~60% of the meniscus width at the posterior horn with horizontal  tear medial to radial tear component extending to meniscus body 12. Medial compartment cartilage: Focal Grade 4 degenerative changes to the medial femoral condyle with normal cartilage around this region; Grade 3-4 degenerative changes to the central tibial plateau 13. Lateral meniscus: Normal 14. Lateral compartment cartilage: Grade 1-2 degenerative changes to the tibial plateau and normal lateral femoral condyle   OPERATIVE REPORT:     I identified Donald Barker. in the pre-operative holding area. I marked the operative knee with my initials. I reviewed the risks and benefits of the proposed surgical intervention and the patient wished to proceed. The patient was transferred to the operative suite and placed in the supine position with all bony prominences padded.  Anesthesia was administered. Appropriate IV antibiotics were administered prior to incision. The extremity was then prepped and draped in standard fashion. A time out was performed confirming the correct extremity, correct patient, and correct procedure.   Arthroscopy portals were marked. Local anesthetic was injected to the planned portal sites. The anterolateral portal was established with an 11 blade.      The arthroscope was placed in the anterolateral portal and then into the suprapatellar pouch. Next, the medial portal was established under needle localization. A diagnostic knee scope was completed with the above findings. The medial meniscus tear was identified.   The MCL was pie-crusted to improve visualization of the posterior horn. The meniscal tear was debrided using an arthroscopic biter and an oscillating shaver until the meniscus had stable borders. A chondroplasty was performed of the medial compartment such that there were stable cartilage edges without any loose fragments of cartilage. Arthroscopic fluid was removed from the joint.   The portals were closed with  3-0 Nylon suture. Sterile dressings included Xeroform,  4x4s, Sof-Rol, and Bias wrap. A Polarcare was placed.  The patient was then awakened and taken to the PACU hemodynamically stable without complication.     POSTOPERATIVE PLAN: The patient will be discharged home today once they meet PACU criteria. Aspirin 325 mg daily was prescribed for 2 weeks for DVT prophylaxis.  Physical therapy will start on POD#3-4. Weight-bearing as tolerated. Follow up in 2 weeks per protocol.

## 2020-07-15 NOTE — Anesthesia Procedure Notes (Signed)
Procedure Name: Intubation Performed by: Norma Montemurro Ben, CRNA Pre-anesthesia Checklist: Patient identified, Emergency Drugs available, Suction available and Patient being monitored Patient Re-evaluated:Patient Re-evaluated prior to induction Oxygen Delivery Method: Circle system utilized Preoxygenation: Pre-oxygenation with 100% oxygen Induction Type: IV induction Ventilation: Mask ventilation without difficulty LMA: LMA inserted LMA Size: 4.0 Tube type: Oral Number of attempts: 1 Airway Equipment and Method: Oral airway Placement Confirmation: positive ETCO2 and breath sounds checked- equal and bilateral Tube secured with: Tape Dental Injury: Teeth and Oropharynx as per pre-operative assessment        

## 2020-07-15 NOTE — Discharge Instructions (Addendum)
Arthroscopic Knee Surgery - Partial Meniscectomy   Post-Op Instructions   1. Bracing or crutches: Crutches will be provided at the time of discharge from the surgery center if you do not already have them.   2. Ice: You may be provided with a device (Polar Care) that allows you to ice the affected area effectively. Otherwise you can ice manually.    3. Driving:  Plan on not driving for at least two weeks. Please note that you are advised NOT to drive while taking narcotic pain medications as you may be impaired and unsafe to drive.   4. Activity: Ankle pumps several times an hour while awake to prevent blood clots. Weight bearing: as tolerated. Use crutches for as needed (usually ~1 week or less) until pain allows you to ambulate without a limp. Bending and straightening the knee is unlimited. Elevate knee above heart level as much as possible for one week. Avoid standing more than 5 minutes (consecutively) for the first week.  Avoid long distance travel for 2 weeks.  5. Medications:  - You have been provided a prescription for narcotic pain medicine. After surgery, take 1-2 narcotic tablets every 4 hours if needed for severe pain.  - You may take up to 3000mg/day of tylenol (acetaminophen). You can take 1000mg 3x/day. Please check your narcotic. If you have acetaminophen in your narcotic (each tablet will be 325mg), be careful not to exceed a total of 3000mg/day of acetaminophen.  - A prescription for anti-nausea medication will be provided in case the narcotic medicine or anesthesia causes nausea - take 1 tablet every 6 hours only if nauseated.  - Take ibuprofen 800 mg every 8 hours WITH food to reduce post-operative knee swelling. DO NOT STOP IBUPROFEN POST-OP UNTIL INSTRUCTED TO DO SO at first post-op office visit (10-14 days after surgery). However, please discontinue if you have any abdominal discomfort after taking this.  - Take enteric coated aspirin 325 mg once daily for 2 weeks to prevent  blood clots.    6. Bandages: The physical therapist should change the bandages at the first post-op appointment. If needed, the dressing supplies have been provided to you.   7. Physical Therapy: 1-2 times per week for 6 weeks. Therapy typically starts on post operative Day 3 or 4. You have been provided an order for physical therapy. The therapist will provide home exercises.   8. Work: May return to full work usually around 2 weeks after 1st post-operative visit. May do light duty/desk job in approximately 1-2 weeks when off of narcotics, pain is well-controlled, and swelling has decreased. Labor intensive jobs may require 4-6 weeks to return.      9. Post-Op Appointments: Your first post-op appointment will be with Dr. Patel in approximately 2 weeks time.    If you find that they have not been scheduled please call the Orthopaedic Appointment front desk at [phone].  AMBULATORY SURGERY  DISCHARGE INSTRUCTIONS   1) The drugs that you were given will stay in your system until tomorrow so for the next 24 hours you should not:  A) Drive an automobile B) Make any legal decisions C) Drink any alcoholic beverage   2) You may resume regular meals tomorrow.  Today it is better to start with liquids and gradually work up to solid foods.  You may eat anything you prefer, but it is better to start with liquids, then soup and crackers, and gradually work up to solid foods.   3) Please notify your   doctor immediately if you have any unusual bleeding, trouble breathing, redness and pain at the surgery site, drainage, fever, or pain not relieved by medication.    4) Additional Instructions:        Please contact your physician with any problems or Same Day Surgery at [phone], Monday through Friday 6 am to 4 pm, or [facility] [place] Main number at [phone].  

## 2020-07-16 ENCOUNTER — Encounter: Payer: Self-pay | Admitting: Orthopedic Surgery

## 2020-12-09 ENCOUNTER — Other Ambulatory Visit: Payer: Self-pay | Admitting: Orthopedic Surgery

## 2020-12-09 DIAGNOSIS — S83241A Other tear of medial meniscus, current injury, right knee, initial encounter: Secondary | ICD-10-CM

## 2020-12-13 ENCOUNTER — Other Ambulatory Visit: Payer: Self-pay

## 2020-12-13 ENCOUNTER — Ambulatory Visit
Admission: RE | Admit: 2020-12-13 | Discharge: 2020-12-13 | Disposition: A | Discharge status: Home / Self Care | Payer: Medicare HMO | Source: Ambulatory Visit | Attending: Orthopedic Surgery | Admitting: Orthopedic Surgery

## 2020-12-13 DIAGNOSIS — S83241A Other tear of medial meniscus, current injury, right knee, initial encounter: Secondary | ICD-10-CM | POA: Insufficient documentation

## 2020-12-15 ENCOUNTER — Other Ambulatory Visit: Payer: Self-pay | Admitting: Orthopedic Surgery

## 2020-12-21 ENCOUNTER — Encounter: Payer: Self-pay | Admitting: Orthopedic Surgery

## 2020-12-28 ENCOUNTER — Other Ambulatory Visit
Admission: RE | Admit: 2020-12-28 | Discharge: 2020-12-28 | Disposition: A | Discharge status: Home / Self Care | Payer: Medicare HMO | Source: Ambulatory Visit | Attending: Orthopedic Surgery | Admitting: Orthopedic Surgery

## 2020-12-28 ENCOUNTER — Other Ambulatory Visit: Payer: Self-pay

## 2020-12-28 DIAGNOSIS — Z01812 Encounter for preprocedural laboratory examination: Secondary | ICD-10-CM | POA: Diagnosis present

## 2020-12-28 DIAGNOSIS — Z20822 Contact with and (suspected) exposure to covid-19: Secondary | ICD-10-CM | POA: Diagnosis not present

## 2020-12-29 LAB — SARS CORONAVIRUS 2 (TAT 6-24 HRS): SARS Coronavirus 2: NEGATIVE

## 2020-12-30 ENCOUNTER — Ambulatory Visit: Payer: Medicare HMO | Admitting: Anesthesiology

## 2020-12-30 ENCOUNTER — Encounter: Payer: Self-pay | Admitting: Orthopedic Surgery

## 2020-12-30 ENCOUNTER — Other Ambulatory Visit: Payer: Self-pay

## 2020-12-30 ENCOUNTER — Ambulatory Visit
Admission: RE | Admit: 2020-12-30 | Discharge: 2020-12-30 | Disposition: A | Discharge status: Home / Self Care | Payer: Medicare HMO | Attending: Orthopedic Surgery | Admitting: Orthopedic Surgery

## 2020-12-30 ENCOUNTER — Encounter
Admission: RE | Disposition: A | Discharge status: Home / Self Care | Payer: Self-pay | Source: Home / Self Care | Attending: Orthopedic Surgery

## 2020-12-30 DIAGNOSIS — M1711 Unilateral primary osteoarthritis, right knee: Secondary | ICD-10-CM | POA: Insufficient documentation

## 2020-12-30 DIAGNOSIS — M659 Synovitis and tenosynovitis, unspecified: Secondary | ICD-10-CM | POA: Insufficient documentation

## 2020-12-30 DIAGNOSIS — Z87891 Personal history of nicotine dependence: Secondary | ICD-10-CM | POA: Diagnosis not present

## 2020-12-30 HISTORY — PX: KNEE ARTHROSCOPY: SHX127

## 2020-12-30 SURGERY — ARTHROSCOPY, KNEE
Anesthesia: General | Site: Knee | Laterality: Right

## 2020-12-30 MED ORDER — ASPIRIN EC 325 MG PO TBEC: 325.0000 mg | DELAYED_RELEASE_TABLET | Freq: Every day | ORAL | 0 refills | Status: AC

## 2020-12-30 MED ORDER — ONDANSETRON 4 MG PO TBDP: 4.0000 mg | ORAL_TABLET | Freq: Three times a day (TID) | ORAL | 0 refills | Status: DC | PRN

## 2020-12-30 MED ORDER — OXYCODONE HCL 5 MG/5ML PO SOLN: 5.0000 mg | Freq: Once | ORAL | Status: AC | PRN

## 2020-12-30 MED ORDER — MIDAZOLAM HCL 5 MG/5ML IJ SOLN
INTRAMUSCULAR | Status: DC | PRN
  Administered 2020-12-30: 2 mg via INTRAVENOUS

## 2020-12-30 MED ORDER — LIDOCAINE HCL (CARDIAC) PF 100 MG/5ML IV SOSY
PREFILLED_SYRINGE | INTRAVENOUS | Status: DC | PRN
  Administered 2020-12-30: 30 mg via INTRATRACHEAL

## 2020-12-30 MED ORDER — DEXAMETHASONE SODIUM PHOSPHATE 4 MG/ML IJ SOLN
INTRAMUSCULAR | Status: DC | PRN
  Administered 2020-12-30: 4 mg via INTRAVENOUS

## 2020-12-30 MED ORDER — PROPOFOL 10 MG/ML IV BOLUS
INTRAVENOUS | Status: DC | PRN
  Administered 2020-12-30: 150 mg via INTRAVENOUS

## 2020-12-30 MED ORDER — FENTANYL CITRATE (PF) 100 MCG/2ML IJ SOLN: 25.0000 ug | INTRAMUSCULAR | Status: DC | PRN

## 2020-12-30 MED ORDER — GLYCOPYRROLATE 0.2 MG/ML IJ SOLN
INTRAMUSCULAR | Status: DC | PRN
  Administered 2020-12-30: .1 mg via INTRAVENOUS

## 2020-12-30 MED ORDER — FENTANYL CITRATE (PF) 100 MCG/2ML IJ SOLN
INTRAMUSCULAR | Status: DC | PRN
  Administered 2020-12-30: 25 ug via INTRAVENOUS
  Administered 2020-12-30: 12.5 ug via INTRAVENOUS

## 2020-12-30 MED ORDER — OXYCODONE HCL 5 MG PO TABS
5.0000 mg | ORAL_TABLET | Freq: Once | ORAL | Status: AC | PRN
  Administered 2020-12-30: 5 mg via ORAL

## 2020-12-30 MED ORDER — IBUPROFEN 800 MG PO TABS: 800.0000 mg | ORAL_TABLET | Freq: Three times a day (TID) | ORAL | 1 refills | Status: AC

## 2020-12-30 MED ORDER — LIDOCAINE-EPINEPHRINE 1 %-1:100000 IJ SOLN
INTRAMUSCULAR | Status: DC | PRN
  Administered 2020-12-30: 11 mL via INTRAMUSCULAR
  Administered 2020-12-30: 3 mL

## 2020-12-30 MED ORDER — LACTATED RINGERS IV SOLN: INTRAVENOUS | Status: DC

## 2020-12-30 MED ORDER — ACETAMINOPHEN 500 MG PO TABS: 1000.0000 mg | ORAL_TABLET | Freq: Three times a day (TID) | ORAL | 2 refills | Status: AC

## 2020-12-30 MED ORDER — MEPERIDINE HCL 25 MG/ML IJ SOLN: 6.2500 mg | INTRAMUSCULAR | Status: DC | PRN

## 2020-12-30 MED ORDER — PROMETHAZINE HCL 25 MG/ML IJ SOLN: 6.2500 mg | INTRAMUSCULAR | Status: DC | PRN

## 2020-12-30 MED ORDER — CEFAZOLIN SODIUM-DEXTROSE 2-4 GM/100ML-% IV SOLN
2.0000 g | INTRAVENOUS | Status: AC
  Administered 2020-12-30: 2 g via INTRAVENOUS

## 2020-12-30 MED ORDER — ONDANSETRON HCL 4 MG/2ML IJ SOLN
INTRAMUSCULAR | Status: DC | PRN
  Administered 2020-12-30: 4 mg via INTRAVENOUS

## 2020-12-30 SURGICAL SUPPLY — 37 items
ADAPTER IRRIG TUBE 2 SPIKE SOL (ADAPTER) ×4 IMPLANT
ADPR TBG 2 SPK PMP STRL ASCP (ADAPTER) ×2
APL PRP STRL LF DISP 70% ISPRP (MISCELLANEOUS) ×1
BLADE SURG SZ11 CARB STEEL (BLADE) ×2 IMPLANT
BNDG COHESIVE 4X5 TAN STRL (GAUZE/BANDAGES/DRESSINGS) ×2 IMPLANT
BNDG ESMARK 6X12 TAN STRL LF (GAUZE/BANDAGES/DRESSINGS) ×2 IMPLANT
BUR RADIUS 4.0X18.5 (BURR) ×2 IMPLANT
CHLORAPREP W/TINT 26 (MISCELLANEOUS) ×2 IMPLANT
COOLER POLAR GLACIER W/PUMP (MISCELLANEOUS) ×2 IMPLANT
COVER LIGHT HANDLE UNIVERSAL (MISCELLANEOUS) ×4 IMPLANT
CUFF TOURN SGL QUICK 30 (TOURNIQUET CUFF) ×2
CUFF TRNQT CYL 30X4X21-28X (TOURNIQUET CUFF) ×1 IMPLANT
DECANTER SPIKE VIAL GLASS SM (MISCELLANEOUS) ×4 IMPLANT
DRAPE IMP U-DRAPE 54X76 (DRAPES) ×2 IMPLANT
DRSG TELFA 4X3 1S NADH ST (GAUZE/BANDAGES/DRESSINGS) ×2 IMPLANT
GAUZE SPONGE 4X4 12PLY STRL (GAUZE/BANDAGES/DRESSINGS) IMPLANT
GLOVE BIO SURGEON STRL SZ7.5 (GLOVE) ×4 IMPLANT
GOWN STRL REIN 2XL XLG LVL4 (GOWN DISPOSABLE) ×2 IMPLANT
GOWN STRL REUS W/TWL LRG LVL3 (GOWN DISPOSABLE) ×2 IMPLANT
IV LACTATED RINGER IRRG 3000ML (IV SOLUTION) ×18
IV LR IRRIG 3000ML ARTHROMATIC (IV SOLUTION) ×9 IMPLANT
KIT TURNOVER KIT A (KITS) ×2 IMPLANT
MANIFOLD NEPTUNE II (INSTRUMENTS) ×2 IMPLANT
MAT ABSORB  FLUID 56X50 GRAY (MISCELLANEOUS) ×1
MAT ABSORB FLUID 56X50 GRAY (MISCELLANEOUS) ×1 IMPLANT
PACK ARTHROSCOPY KNEE (MISCELLANEOUS) ×2 IMPLANT
PAD ABD DERMACEA PRESS 5X9 (GAUZE/BANDAGES/DRESSINGS) IMPLANT
PAD WRAPON POLAR KNEE (MISCELLANEOUS) ×1 IMPLANT
PADDING CAST BLEND 6X4 STRL (MISCELLANEOUS) ×1 IMPLANT
PADDING STRL CAST 6IN (MISCELLANEOUS) ×1
SET TUBE SUCT SHAVER OUTFL 24K (TUBING) ×2 IMPLANT
SUT ETHILON 3-0 FS-10 30 BLK (SUTURE) ×2
SUTURE EHLN 3-0 FS-10 30 BLK (SUTURE) ×1 IMPLANT
TOWEL OR 17X26 4PK STRL BLUE (TOWEL DISPOSABLE) ×4 IMPLANT
TUBING ARTHRO INFLOW-ONLY STRL (TUBING) ×2 IMPLANT
WAND WEREWOLF FLOW 90D (MISCELLANEOUS) ×2 IMPLANT
WRAPON POLAR PAD KNEE (MISCELLANEOUS) ×2

## 2020-12-30 NOTE — Anesthesia Procedure Notes (Addendum)
Procedure Name: LMA Insertion Date/Time: 12/30/2020 10:08 AM Performed by: Maree Krabbe, CRNA Pre-anesthesia Checklist: Patient identified, Emergency Drugs available, Suction available, Timeout performed and Patient being monitored Patient Re-evaluated:Patient Re-evaluated prior to induction Oxygen Delivery Method: Circle system utilized Preoxygenation: Pre-oxygenation with 100% oxygen Induction Type: IV induction LMA: LMA inserted LMA Size: 4.0 Number of attempts: 1 Placement Confirmation: positive ETCO2 and breath sounds checked- equal and bilateral Tube secured with: Tape Dental Injury: Teeth and Oropharynx as per pre-operative assessment

## 2020-12-30 NOTE — Discharge Instructions (Signed)
Arthroscopic Knee Surgery   Post-Op Instructions   1. Bracing or crutches: Crutches will be provided at the time of discharge from the surgery center if you do not already have them.   2. Ice: You may be provided with a device [facility]) that allows you to ice the affected area effectively. Otherwise you can ice manually.    3. Driving:  Plan on not driving for at least one week. Please note that you are advised NOT to drive while taking narcotic pain medications as you may be impaired and unsafe to drive.   4. Activity: Ankle pumps several times an hour while awake to prevent blood clots. Weight bearing: as tolerated. Use crutches for as needed (usually ~1 week or less) until pain allows you to ambulate without a limp. Bending and straightening the knee is unlimited. Elevate knee above heart level as much as possible for one week. Avoid standing more than 5 minutes (consecutively) for the first week.  Avoid long distance travel for 2 weeks.   5. Medications:  - After surgery, take 1-2 narcotic tablets every 4 hours if needed for severe pain.  - You may take up to 3000mg /day of tylenol (acetaminophen). You can take 1000mg  3x/day. Please check your narcotic. If you have acetaminophen in your narcotic (each tablet will be 325mg ), be careful not to exceed a total of 3000mg /day of acetaminophen.  - A prescription for anti-nausea medication will be provided in case the narcotic medicine causes nausea - take 1 tablet every 6 hours only if nauseated.  - Take ibuprofen 800 mg every 8 hours WITH food to reduce post-operative knee swelling. DO NOT STOP IBUPROFEN POST-OP UNTIL INSTRUCTED TO DO SO at first post-op office visit (10-14 days after surgery). However, please discontinue if you have any abdominal discomfort after taking this.  - Take enteric coated aspirin 325 mg once daily for 2 weeks to prevent blood clots.   6. Bandages: The physical therapist should change the bandages at the first post-op  appointment. If needed, the dressing supplies have been provided to you.   7. Physical Therapy: 1-2 times per week for 6 weeks. Therapy typically starts on post operative Day 3 or 4. You have been provided an order for physical therapy. The therapist will provide home exercises.   8. Work: May return to full work usually around 2 weeks after 1st post-operative visit. May do light duty/desk job in approximately 1-2 weeks when off of narcotics, pain is well-controlled, and swelling has decreased. Labor intensive jobs may require 4-6 weeks to return.      9. Post-Op Appointments: Your first post-op appointment will be with Dr. in approximately 2 weeks time.    If you find that they have not been scheduled please call the Orthopaedic Appointment front desk at [phone].     General Anesthesia, Adult, Care After This sheet gives you information about how to care for yourself after your procedure. Your health care provider may also give you more specific instructions. If you have problems or questions, contact your health care provider. What can I expect after the procedure? After the procedure, the following side effects are common:  Pain or discomfort at the IV site.  Nausea.  Vomiting.  Sore throat.  Trouble concentrating.  Feeling cold or chills.  Feeling weak or tired.  Sleepiness and fatigue.  Soreness and body aches. These side effects can affect parts of the body that were not involved in surgery. Follow these instructions at home:  For the time period you were told by your health care provider:  Rest.  Do not participate in activities where you could fall or become injured.  Do not drive or use machinery.  Do not drink alcohol.  Do not take sleeping pills or medicines that cause drowsiness.  Do not make important decisions or sign legal documents.  Do not take care of children on your own.   Eating and drinking  Follow any instructions from your  health care provider about eating or drinking restrictions.  When you feel hungry, start by eating small amounts of foods that are soft and easy to digest (bland), such as toast. Gradually return to your regular diet.  Drink enough fluid to keep your urine pale yellow.  If you vomit, rehydrate by drinking water, juice, or clear broth. General instructions  If you have sleep apnea, surgery and certain medicines can increase your risk for breathing problems. Follow instructions from your health care provider about wearing your sleep device: ? Anytime you are sleeping, including during daytime naps. ? While taking prescription pain medicines, sleeping medicines, or medicines that make you drowsy.  Have a responsible adult stay with you for the time you are told. It is important to have someone help care for you until you are awake and alert.  Return to your normal activities as told by your health care provider. Ask your health care provider what activities are safe for you.  Take over-the-counter and prescription medicines only as told by your health care provider.  If you smoke, do not smoke without supervision.  Keep all follow-up visits as told by your health care provider. This is important. Contact a health care provider if:  You have nausea or vomiting that does not get better with medicine.  You cannot eat or drink without vomiting.  You have pain that does not get better with medicine.  You are unable to pass urine.  You develop a skin rash.  You have a fever.  You have redness around your IV site that gets worse. Get help right away if:  You have difficulty breathing.  You have chest pain.  You have blood in your urine or stool, or you vomit blood. Summary  After the procedure, it is common to have a sore throat or nausea. It is also common to feel tired.  Have a responsible adult stay with you for the time you are told. It is important to have someone help care  for you until you are awake and alert.  When you feel hungry, start by eating small amounts of foods that are soft and easy to digest (bland), such as toast. Gradually return to your regular diet.  Drink enough fluid to keep your urine pale yellow.  Return to your normal activities as told by your health care provider. Ask your health care provider what activities are safe for you. This information is not intended to replace advice given to you by your health care provider. Make sure you discuss any questions you have with your health care provider. Document Revised: 08/04/2020 Document Reviewed: 03/03/2020 Elsevier Patient Education  2021 [employer].

## 2020-12-30 NOTE — Transfer of Care (Signed)
Immediate Anesthesia Transfer of Care Note  Patient: Donald Barker.  Procedure(s) Performed: Right kne arthroscopic partial synovectomy (Right Knee)  Patient Location: PACU  Anesthesia Type: General  Level of Consciousness: awake, alert  and patient cooperative  Airway and Oxygen Therapy: Patient Spontanous Breathing and Patient connected to supplemental oxygen  Post-op Assessment: Post-op Vital signs reviewed, Patient's Cardiovascular Status Stable, Respiratory Function Stable, Patent Airway and No signs of Nausea or vomiting  Post-op Vital Signs: Reviewed and stable  Complications: No complications documented.

## 2020-12-30 NOTE — Op Note (Signed)
Operative Note    SURGERY DATE: 12/30/2020   PRE-OP DIAGNOSIS:  1. Right knee synovitis 2. Right tricompartmental degenerative changes   POST-OP DIAGNOSIS:  1. Right knee synovitis 2.  Right tricompartmental degenerative changes 3.  Right knee medial plica band   PROCEDURES:  1.    Right knee arthroscopy with partial synovectomy of the patellofemoral and medial compartments  SURGEON: Rosealee Albee, MD   ANESTHESIA: Gen   ESTIMATED BLOOD LOSS: minimal   TOTAL IV FLUIDS: per anesthesia  SPECIMEN: Knee mass sent for pathology   INDICATION(S):  Donald Barker. is a 75 y.o. male who initially underwent right knee partial medial meniscectomy and chondroplasty on 07/15/2020.  He was doing well postoperatively until he developed sensations of stiffness within the knee and further occasional medial sided pain.  There was a palpable mass along the anterolateral aspect of the knee just superior to the patella.  Repeat MRI showed a focal abnormal signal/mass consistent with synovitic changes. Ultrasound mass confirmed that this was completely within the suprapatellar pouch without any herniation through the capsule or surrounding quadriceps muscle.   After discussion of risks, benefits, and alternatives to surgery, the patient elected to proceed.   OPERATIVE FINDINGS:    Examination under anesthesia: A careful examination under anesthesia was performed.  Passive range of motion was: Hyperextension: 1.  Extension: 0.  Flexion: 125.  Lachman: normal. Pivot Shift: normal.  Posterior drawer: normal.  Varus stability in full extension: normal.  Varus stability in 30 degrees of flexion: normal.  Valgus stability in full extension: normal.  Valgus stability in 30 degrees of flexion: normal.   Intra-operative findings: A thorough arthroscopic examination of the knee was performed.  The findings are: 1. Suprapatellar pouch: Band of synovium along the anterolateral aspect of the suprapatellar pouch with  further focal mass of synovium along the anterolateral aspect of the distal femur 2. Undersurface of median ridge:Grade 2 degenerative changes 3. Medial patellar facet:Grade 1-2 degenerative changes 4. Lateral patellar facet: Grade 1-2 degenerative changes 5. Trochlea: Grade 1 degenerative changes 6. Lateral gutter/popliteus tendon: Normal 7. Hoffa's fat pad: Inflamed 8. Medial gutter/plica: Medial plica band present 9. ACL: Normal 10. PCL: Normal 11. Medial meniscus: No recurrent meniscus tear.  There was a small fragment (approximately 1-2 mm) on the inferior aspect of the meniscal body that was slightly mobile  12. Medial compartment cartilage: Focal Grade 4 degenerative changesto the medial femoral condyle with normal cartilage around this region; Grade 3-4 degenerative changes to the central tibial plateau 13. Lateral meniscus: Normal 14. Lateral compartment cartilage:Grade 1-2 degenerative changes to the tibial plateau and normal lateral femoral condyle   OPERATIVE REPORT:     I identified Donald Barker. in the pre-operative holding area. I marked the operative knee with my initials.  Additionally, I marked the affected area about the anterolateral aspect of the suprapatellar pouch with a marker.  I reviewed the risks and benefits of the proposed surgical intervention and the patient wished to proceed. The patient was transferred to the operative suite and placed in the supine position with all bony prominences padded.  Anesthesia was administered. Appropriate IV antibiotics were administered prior to incision. The extremity was then prepped and draped in standard fashion. A time out was performed confirming the correct extremity, correct patient, and correct procedure.   Prior arthroscopy portals were marked and utilized. Local anesthetic was injected to the planned portal sites. The anterolateral portal was established with an 11 blade.  The arthroscope was placed in the  anterolateral portal and then into the suprapatellar pouch. Next, the medial portal was established under needle localization. A diagnostic knee scope was completed with the above findings.    The undersurface flap of the body of the medial meniscus was debrided using an oscillating shaver.  The medial plica band was excised using oscillating shaver and ArthroCare wand since that there was no irritation on the medial femoral condyle.  The band about the anterolateral suprapatellar pouch was excised using an ArthroCare wand and oscillating shaver.  A spinal needle was placed through the affected area.  There was a small mass that appeared to be synovium along the anterolateral aspect of the distal femur.  A biopsy of this specimen was taken and sent for pathology.  The remainder of this tissue was then debrided using oscillating shaver and ArthroCare wand.  Arthroscopic fluid was then removed from the joint.  The prior mass along the anterolateral aspect of the suprapatellar pouch could no longer be palpated both in extension and flexion.    The portals were closed with 3-0 Nylon suture. Sterile dressings included Xeroform, 4x4s, Sof-Rol, and Bias wrap. A Polarcare was placed.  The patient was then awakened and taken to the PACU hemodynamically stable without complication.   POSTOPERATIVE PLAN: The patient will be discharged home today once they meet PACU criteria. Aspirin 325 mg daily was prescribed for 2 weeks for DVT prophylaxis.  Physical therapy will start on POD#3-4. Weight-bearing as tolerated. Follow up in 2 weeks per protocol.

## 2020-12-30 NOTE — Anesthesia Preprocedure Evaluation (Signed)
Anesthesia Evaluation  Patient identified by MRN, date of birth, ID band Patient awake    Reviewed: Allergy & Precautions, H&P , NPO status , Patient's Chart, lab work & pertinent test results, reviewed documented beta blocker date and time   Airway Mallampati: II  TM Distance: >3 FB Neck ROM: full    Dental no notable dental hx.    Pulmonary neg pulmonary ROS, former smoker,    Pulmonary exam normal breath sounds clear to auscultation       Cardiovascular Exercise Tolerance: Good negative cardio ROS   Rhythm:regular Rate:Normal     Neuro/Psych negative neurological ROS  negative psych ROS   GI/Hepatic negative GI ROS, Neg liver ROS,   Endo/Other  negative endocrine ROS  Renal/GU negative Renal ROS  negative genitourinary   Musculoskeletal   Abdominal   Peds  Hematology negative hematology ROS (+)   Anesthesia Other Findings   Reproductive/Obstetrics negative OB ROS                             Anesthesia Physical Anesthesia Plan  ASA: II  Anesthesia Plan: General   Post-op Pain Management:    Induction:   PONV Risk Score and Plan: 2  Airway Management Planned:   Additional Equipment:   Intra-op Plan:   Post-operative Plan:   Informed Consent: I have reviewed the patients History and Physical, chart, labs and discussed the procedure including the risks, benefits and alternatives for the proposed anesthesia with the patient or authorized representative who has indicated his/her understanding and acceptance.     Dental Advisory Given  Plan Discussed with: CRNA  Anesthesia Plan Comments:         Anesthesia Quick Evaluation

## 2020-12-30 NOTE — H&P (Signed)
Paper H&P to be scanned into permanent record. H&P reviewed. No significant changes noted.  

## 2020-12-30 NOTE — Anesthesia Postprocedure Evaluation (Signed)
Anesthesia Post Note  Patient: Donald Barker.  Procedure(s) Performed: Right kne arthroscopic partial synovectomy (Right Knee)     Patient location during evaluation: PACU Anesthesia Type: General Level of consciousness: awake and alert Pain management: pain level controlled Vital Signs Assessment: post-procedure vital signs reviewed and stable Respiratory status: spontaneous breathing, nonlabored ventilation, respiratory function stable and patient connected to nasal cannula oxygen Cardiovascular status: blood pressure returned to baseline and stable Postop Assessment: no apparent nausea or vomiting Anesthetic complications: no   No complications documented.  [name], Salvadore Dom

## 2021-01-02 ENCOUNTER — Encounter: Payer: Self-pay | Admitting: Orthopedic Surgery

## 2021-01-03 LAB — SURGICAL PATHOLOGY

## 2021-07-15 IMAGING — MR MR KNEE*R* W/O CM
6 series · 40 of 40 positions shown · non-contrast
Comparison: MRI right knee 06/21/2020.

CLINICAL DATA: History right knee surgery in September 2020 for
meniscal tear. Right knee pain for 2 months after getting up and
down repeatedly while working on base boards.

EXAM:
MRI OF THE RIGHT KNEE WITHOUT CONTRAST
TECHNIQUE: Multiplanar, multisequence MR imaging of the knee was performed. No
intravenous contrast was administered.

[Series 8: T2 fat-sat · axial · right · 4.0mm · 0.50mm/px · z∈[-77,+47]mm · 7 of 26 slices shown (1 of 3)]
[im 1/26]
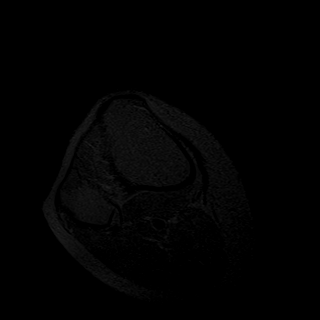
[im 5/26]
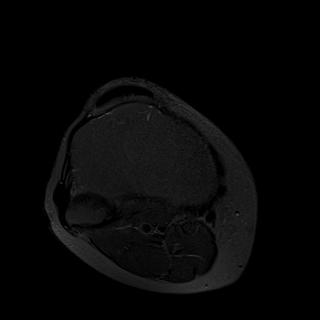
[im 9/26]
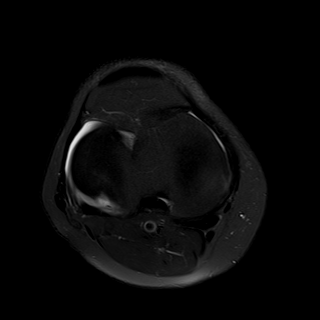
[im 13/26]
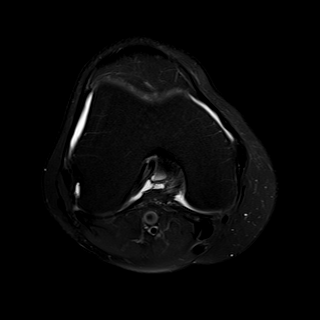
[im 17/26]
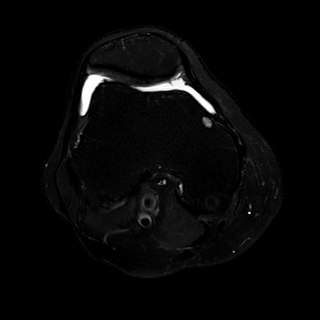
[im 21/26]
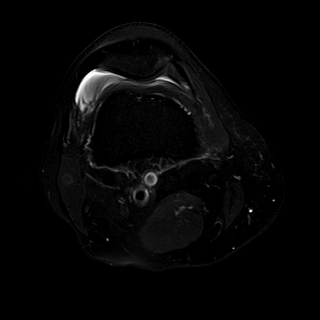
[im 26/26]
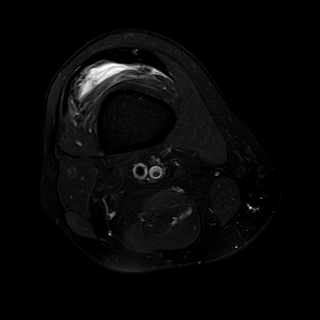

[Series 9: T1 · coronal · right · 4.0mm · 0.39mm/px · 6 of 28 slices shown]
[im 1/28]
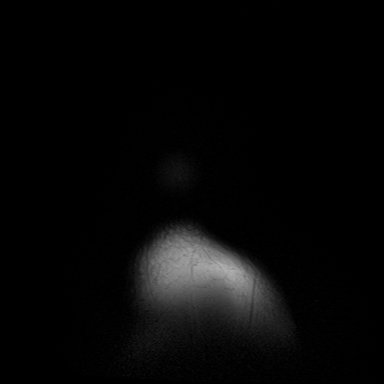
[im 6/28]
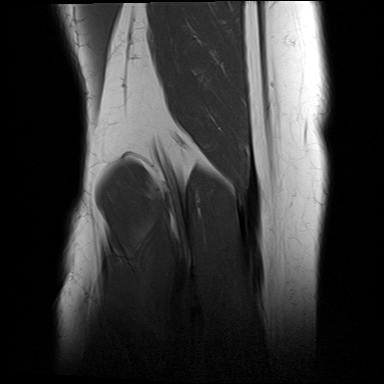
[im 11/28]
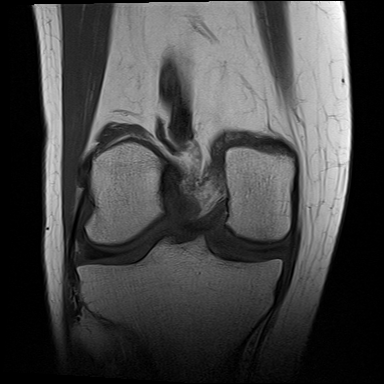
[im 17/28]
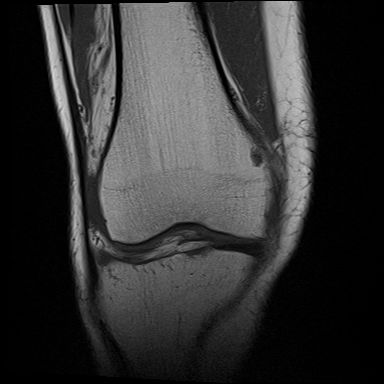
[im 22/28]
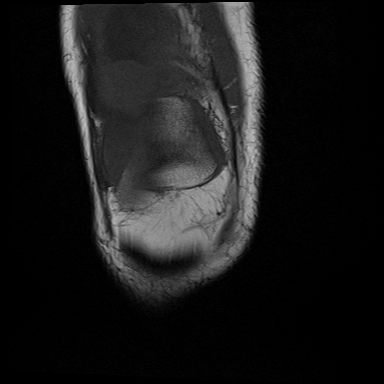
[im 28/28]
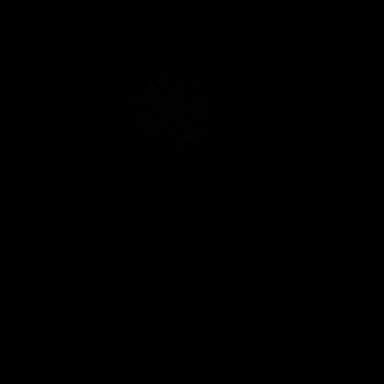

[Series 10: T2 fat-sat · coronal · right · 4.0mm · 0.59mm/px · 6 of 28 slices shown (2 of 3)]
[im 1/28]
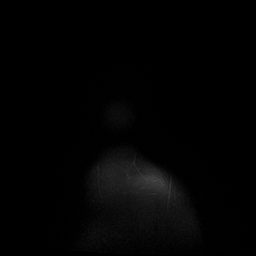
[im 6/28]
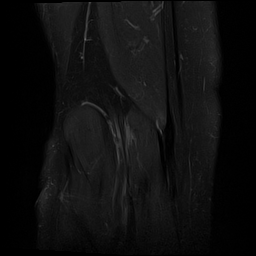
[im 11/28]
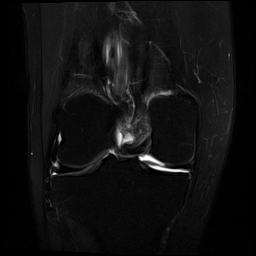
[im 17/28]
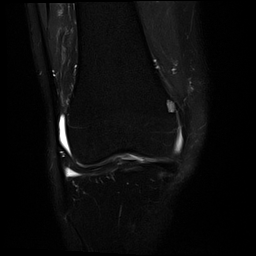
[im 22/28]
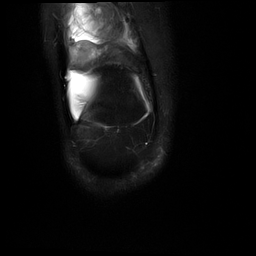
[im 28/28]
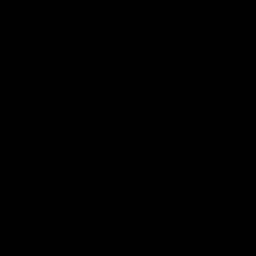

[Series 11: PD fat-sat · sagittal · right · 3.0mm · 0.59mm/px · 7 of 33 slices shown (1 of 2)]
[im 1/33]
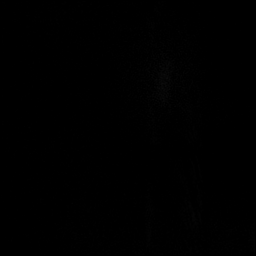
[im 6/33]
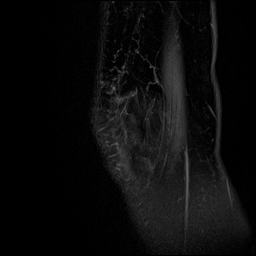
[im 11/33]
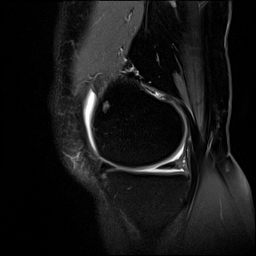
[im 17/33]
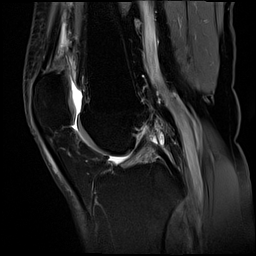
[im 22/33]
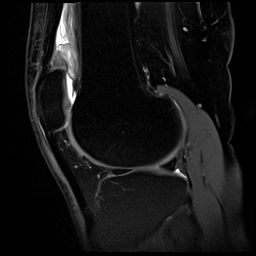
[im 27/33]
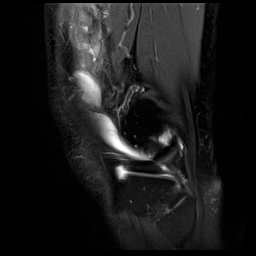
[im 33/33]
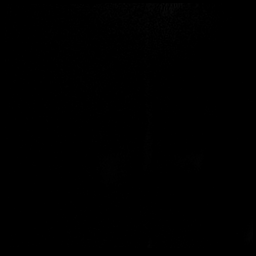

[Series 12: PD fat-sat · coronal · right · 4.0mm · 0.59mm/px · 6 of 28 slices shown (2 of 2)]
[im 1/28]
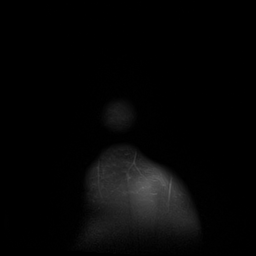
[im 6/28]
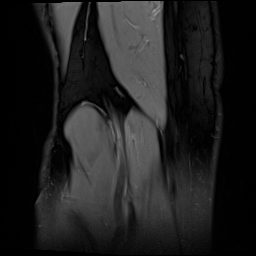
[im 11/28]
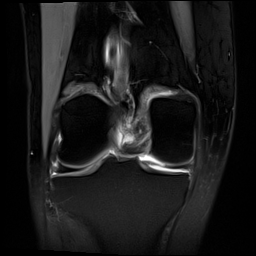
[im 17/28]
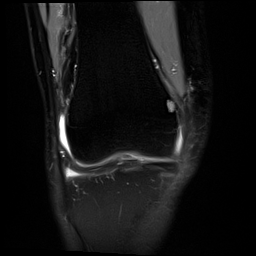
[im 22/28]
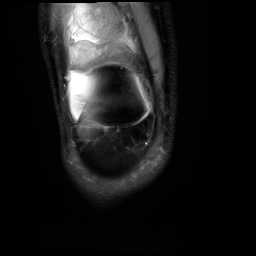
[im 28/28]
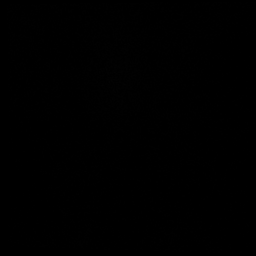

[Series 13: T2 fat-sat · sagittal · right · 3.0mm · 0.59mm/px · 8 of 34 slices shown (3 of 3)]
[im 1/34]
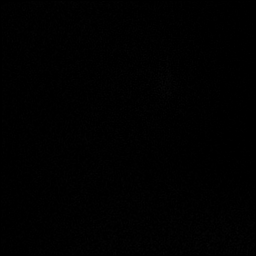
[im 5/34]
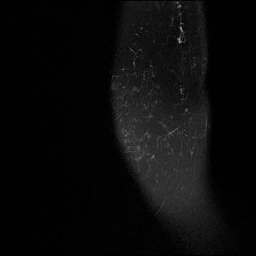
[im 10/34]
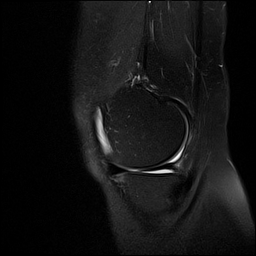
[im 15/34]
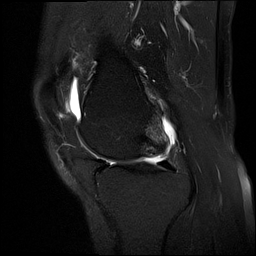
[im 19/34]
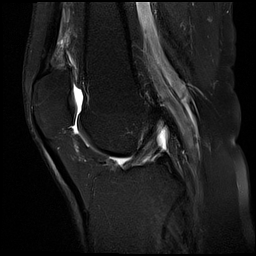
[im 24/34]
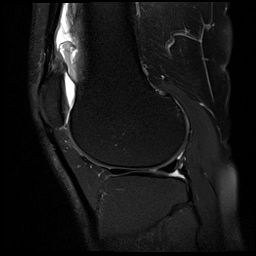
[im 29/34]
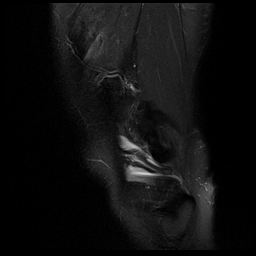
[im 34/34]
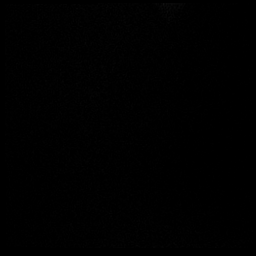

[40 of 40 positions shown; findings below may reference images not displayed]

FINDINGS: MENISCI

Medial meniscus: Tear in the posterior horn seen on the prior
examination has been debrided. No new tear is identified.

Lateral meniscus:  Intact.

LIGAMENTS

Cruciates:  Intact.

Collaterals:  Intact.

CARTILAGE

Patellofemoral:  Mildly to moderately degenerated, unchanged.

Medial:  Minimally degenerated.

Lateral:  Preserved.

Joint: A focus of abnormal signal measuring approximately 3 cm
transverse x 1.5 cm AP x 5 cm craniocaudal is seen in the
patellofemoral compartment eccentric toward the lateral side and
anterior to the distal femur.

Popliteal Fossa:  No Baker's cyst.

Extensor Mechanism:  Intact.

Bones:  Negative.

Other: None.
IMPRESSION: Large focus of abnormal signal in the patellofemoral compartment
most consistent with synovitis.

Negative for meniscal or ligament tear. Status post debridement of a
tear of the medial meniscus.

## 2022-08-15 ENCOUNTER — Encounter: Admission: RE | Payer: Self-pay | Source: Home / Self Care

## 2022-08-15 ENCOUNTER — Ambulatory Visit: Admission: RE | Admit: 2022-08-15 | Payer: Medicare HMO | Source: Home / Self Care | Admitting: Internal Medicine

## 2022-08-15 DIAGNOSIS — R9430 Abnormal result of cardiovascular function study, unspecified: Secondary | ICD-10-CM

## 2022-08-15 DIAGNOSIS — R0602 Shortness of breath: Secondary | ICD-10-CM

## 2022-08-15 SURGERY — LEFT HEART CATH AND CORONARY ANGIOGRAPHY
Anesthesia: Moderate Sedation

## 2022-09-12 ENCOUNTER — Ambulatory Visit
Admission: RE | Admit: 2022-09-12 | Discharge: 2022-09-12 | Disposition: A | Discharge status: Home / Self Care | Payer: Medicare HMO | Attending: Internal Medicine | Admitting: Internal Medicine

## 2022-09-12 ENCOUNTER — Encounter
Admission: RE | Disposition: A | Discharge status: Home / Self Care | Payer: Self-pay | Source: Home / Self Care | Attending: Internal Medicine

## 2022-09-12 ENCOUNTER — Encounter: Payer: Self-pay | Admitting: Internal Medicine

## 2022-09-12 ENCOUNTER — Other Ambulatory Visit: Payer: Self-pay

## 2022-09-12 DIAGNOSIS — R9430 Abnormal result of cardiovascular function study, unspecified: Secondary | ICD-10-CM | POA: Diagnosis present

## 2022-09-12 HISTORY — PX: LEFT HEART CATH AND CORONARY ANGIOGRAPHY: CATH118249

## 2022-09-12 SURGERY — LEFT HEART CATH AND CORONARY ANGIOGRAPHY
Anesthesia: Moderate Sedation

## 2022-09-12 MED ORDER — SODIUM CHLORIDE 0.9 % WEIGHT BASED INFUSION
1.0000 mL/kg/h | INTRAVENOUS | Status: DC
  Administered 2022-09-12: 1 mL/kg/h via INTRAVENOUS

## 2022-09-12 MED ORDER — MIDAZOLAM HCL 2 MG/2ML IJ SOLN
INTRAMUSCULAR | Status: AC
  Filled 2022-09-12: qty 2

## 2022-09-12 MED ORDER — HEPARIN (PORCINE) IN NACL 2000-0.9 UNIT/L-% IV SOLN
INTRAVENOUS | Status: DC | PRN
  Administered 2022-09-12: 1000 mL

## 2022-09-12 MED ORDER — ASPIRIN 81 MG PO CHEW
CHEWABLE_TABLET | ORAL | Status: AC
  Filled 2022-09-12: qty 1

## 2022-09-12 MED ORDER — VERAPAMIL HCL 2.5 MG/ML IV SOLN
INTRAVENOUS | Status: DC | PRN
  Administered 2022-09-12: 2.5 mg via INTRA_ARTERIAL

## 2022-09-12 MED ORDER — SODIUM CHLORIDE 0.9% FLUSH: 3.0000 mL | INTRAVENOUS | Status: DC | PRN

## 2022-09-12 MED ORDER — SODIUM CHLORIDE 0.9 % IV SOLN: 250.0000 mL | INTRAVENOUS | Status: DC | PRN

## 2022-09-12 MED ORDER — HEPARIN (PORCINE) IN NACL 1000-0.9 UT/500ML-% IV SOLN
INTRAVENOUS | Status: AC
  Filled 2022-09-12: qty 1000

## 2022-09-12 MED ORDER — VERAPAMIL HCL 2.5 MG/ML IV SOLN
INTRAVENOUS | Status: AC
  Filled 2022-09-12: qty 2

## 2022-09-12 MED ORDER — HEPARIN SODIUM (PORCINE) 1000 UNIT/ML IJ SOLN
INTRAMUSCULAR | Status: AC
  Filled 2022-09-12: qty 10

## 2022-09-12 MED ORDER — LIDOCAINE HCL 1 % IJ SOLN
INTRAMUSCULAR | Status: AC
  Filled 2022-09-12: qty 20

## 2022-09-12 MED ORDER — SODIUM CHLORIDE 0.9% FLUSH: 3.0000 mL | Freq: Two times a day (BID) | INTRAVENOUS | Status: DC

## 2022-09-12 MED ORDER — FENTANYL CITRATE (PF) 100 MCG/2ML IJ SOLN
INTRAMUSCULAR | Status: DC | PRN
  Administered 2022-09-12: 50 ug via INTRAVENOUS

## 2022-09-12 MED ORDER — IOHEXOL 300 MG/ML  SOLN
INTRAMUSCULAR | Status: DC | PRN
  Administered 2022-09-12: 65 mL

## 2022-09-12 MED ORDER — FENTANYL CITRATE (PF) 100 MCG/2ML IJ SOLN
INTRAMUSCULAR | Status: AC
  Filled 2022-09-12: qty 2

## 2022-09-12 MED ORDER — HEPARIN SODIUM (PORCINE) 1000 UNIT/ML IJ SOLN
INTRAMUSCULAR | Status: DC | PRN
  Administered 2022-09-12: 4000 [IU] via INTRAVENOUS

## 2022-09-12 MED ORDER — LIDOCAINE HCL (PF) 1 % IJ SOLN
INTRAMUSCULAR | Status: DC | PRN
  Administered 2022-09-12: 2 mL

## 2022-09-12 MED ORDER — ASPIRIN 81 MG PO CHEW
81.0000 mg | CHEWABLE_TABLET | ORAL | Status: AC
  Administered 2022-09-12: 81 mg via ORAL

## 2022-09-12 MED ORDER — SODIUM CHLORIDE 0.9 % WEIGHT BASED INFUSION
3.0000 mL/kg/h | INTRAVENOUS | Status: AC
  Administered 2022-09-12: 3 mL/kg/h via INTRAVENOUS

## 2022-09-12 MED ORDER — MIDAZOLAM HCL 2 MG/2ML IJ SOLN
INTRAMUSCULAR | Status: DC | PRN
  Administered 2022-09-12: 1 mg via INTRAVENOUS

## 2022-09-12 SURGICAL SUPPLY — 10 items
CATH 5FR JL3.5 JR4 ANG PIG MP (CATHETERS) IMPLANT
DEVICE RAD TR BAND REGULAR (VASCULAR PRODUCTS) IMPLANT
DRAPE BRACHIAL (DRAPES) IMPLANT
GLIDESHEATH SLEND SS 6F .021 (SHEATH) IMPLANT
GUIDEWIRE INQWIRE 1.5J.035X260 (WIRE) IMPLANT
INQWIRE 1.5J .035X260CM (WIRE) ×1
PACK CARDIAC CATH (CUSTOM PROCEDURE TRAY) ×1 IMPLANT
PROTECTION STATION PRESSURIZED (MISCELLANEOUS) ×1
SET ATX SIMPLICITY (MISCELLANEOUS) IMPLANT
STATION PROTECTION PRESSURIZED (MISCELLANEOUS) IMPLANT

## 2022-09-12 NOTE — Progress Notes (Signed)
MD notified no orders in chart for procedure. Awaiting response

## 2022-09-13 ENCOUNTER — Encounter: Payer: Self-pay | Admitting: Internal Medicine

## 2022-10-04 ENCOUNTER — Other Ambulatory Visit
Admission: RE | Admit: 2022-10-04 | Discharge: 2022-10-04 | Disposition: A | Discharge status: Home / Self Care | Payer: Medicare HMO | Source: Ambulatory Visit | Attending: Pulmonary Disease | Admitting: Pulmonary Disease

## 2022-10-04 DIAGNOSIS — R0689 Other abnormalities of breathing: Secondary | ICD-10-CM | POA: Insufficient documentation

## 2022-10-04 DIAGNOSIS — R0600 Dyspnea, unspecified: Secondary | ICD-10-CM | POA: Diagnosis present

## 2022-10-04 LAB — D-DIMER, QUANTITATIVE: D-Dimer, Quant: <0.27 ug/mL-FEU (ref 0.00–0.50)

## 2022-10-24 NOTE — Progress Notes (Signed)
 Established Patient Visit   Chief Complaint: Chief Complaint  Patient presents with  . Follow-up    CATH   Date of Service: 10/24/2022 Date of Birth: 1945-12-29 PCP: Donald Barker DOUGLAS, MD  History of Present Illness: [name] is a 76 y.o.male patient  Angina The patient has been complaining of symptoms of angina and/or anginal equivalent.  These symptoms began 4 months ago. They describe it as a pressure and squeezing of the left chest area occuring randomly with a duration of intermittent (1-10 minutes) and radiation to none occurring rarely associated with none stable. It is precipitated by breathing and chest wall motion and relieved by position change suggesting non-cardiac chest pain. The patient has risk factors of none and history of none.  We have had a long discussion of further potential for need for evaluation including diagnostic testing as well as modification of cardiovascular risk factors. Cardiac catheterization Results The patient has had a cardiac catheterization showing Normal LV and None coronary atherosclerosis without the need for further intervention  Mixed Hyperlipidemia The patient does have mixed hyperlipidemia with abnormal cholesterol values by previous blood work assessment.  Assessment for cardiovascular risk factors does NOT include very high LDL, high ten year cardiovascular score greater than 7.5%, diabetes, and known vascular disease.  We have had a discussion of the appropriate use of medical management including statin therapy for cardiovascular disease risk reduction.  The patient currently has a reasonably low 10 year cardiovascular risk score and therefore may abstain from statin therapy.     Past Medical and Surgical History  Past Medical History Past Medical History:  Diagnosis Date  . Aphthous ulcer 04/12/2015  . Benign essential HTN 08/08/2022  . History of adenomatous polyp of colon   . History of colon cancer 01/28/2006    Moderately differentiated adenocarcinoma of sigmoid colon  . Pure hypercholesterolemia 04/12/2015    Past Surgical History He has a past surgical history that includes Colonoscopy (01/28/2006); Colonoscopy (05/13/2006); Colonoscopy (06/04/2011); Colonoscopy (05/03/2014); Colonoscopy (01/28/2005); Colonoscopy (08/11/2018); Right knee arthroscopy, partial medial meniscectomy, R knee chondroplasty of medial compartment (Right, 07/15/2020); Knee arthroscopy (Feb 2022); and Cardiac catheterization.   Medications and Allergies  Current Medications  Current Outpatient Medications on File Prior to Visit  Medication Sig Dispense Refill  . meloxicam (MOBIC) 15 MG tablet Take 1 tablet (15 mg total) by mouth once daily as needed for Pain 30 tablet 1  . predniSONE (DELTASONE) 20 MG tablet Take 20 mg by mouth as needed     No current facility-administered medications on file prior to visit.    Allergies: Metoprolol tartrate  Social and Family History  Social History  reports that he quit smoking about 39 years ago. His smoking use included cigarettes. He started smoking about 58 years ago. He has a 20.00 pack-year smoking history. He has never used smokeless tobacco. He reports current alcohol use of about 4.0 standard drinks of alcohol per week. He reports that he does not use drugs.  Family History Family History  Problem Relation Age of Onset  . High blood pressure (Hypertension) Mother   . Meniere's disease Mother   . Thyroid  disease Mother   . Colon cancer Father   . Colon polyps Father   . Cancer Father   . Thyroid  disease Sister   . Thyroid  disease Maternal Aunt   . Thyroid  disease Maternal Aunt   . Thyroid  disease Niece     Review of Systems   Review of Systems  Positive for cp  Negative for weight gain weight loss, weakness, vision change, hearing loss, cough, congestion, PND, orthopnea, heartburn, nausea, diaphoresis, vomiting, diarrhea, bloody stool, melena, stomach pain,  extremity pain, leg weakness, leg cramping, leg blood clots, headache, blackouts, nosebleed, trouble swallowing, mouth pain, urinary frequency, urination at night, muscle weakness, skin lesions, skin rashes, tingling ,ulcers, numbness, anxiety, and/or depression Physical Examination   Vitals:BP 120/80   Pulse 78   Resp 16   Ht 180.3 cm (5' 11)   Wt 75.8 kg (167 lb)   SpO2 99%   BMI 23.29 kg/m  Ht:180.3 cm (5' 11) Wt:75.8 kg (167 lb) ADJ:Anib surface area is 1.95 meters squared. Body mass index is 23.29 kg/m. Appearance: well appearing in no acute distress HEENT: Pupils equally reactive to light and accomodation, no xanthalasma   Neck: Supple, no apparent thyromegaly, masses, or lymphadenopathy  Lungs: normal respiratory effort; no crackles, no rhonchi, no wheezes Heart: Regular rate and rhythm. Normal S1 S2  No gallops, murmur, no rub, PMI is normal size and placement. carotid upstroke normal without bruit. Jugular venous pressure is normal Abdomen: soft, nontender, not distended with normal bowel sounds. No apparent hepatosplenomegally. Abdominal aorta is normal size without bruit Extremities: no edema, no ulcers, no clubbing, no cyanosis Peripheral Pulses: 2+ in upper extremities, 2+ femoral pulses bilaterally, 2+lower extremity  Musculoskeletal;  Normal muscle tone without kyphosis Neurological:   Oriented and Alert, Cranial nerves intact  Assessment   76 y.o. male with  Encounter Diagnoses  Name Primary?  . Pure hypercholesterolemia Yes  . Other chest pain         Plan  -We have discussed risks and benefits of statin therapy.  No additional medication management for mixed hyperlipidemia are necessary due to low 10 year cardiovascular risk at this time. -We have had a discussion of the many causes of chest discomfort and or noncardiac angina.  It is suggested at this time that we may be able to pursue noncardiac evaluation and treatment. -It has been stressed to the  patient that the 4 most important factors of a healthy lifestyle with reduce cardiovascular implications includes abstinence of tobacco use, regular physical activity including the possibility of an exercise prescription, healthy diet, and maintaining an appropriate body mass index.  The patient will consider these approaches in the future.    No orders of the defined types were placed in this encounter.   Return if symptoms worsen or fail to improve.     WOLM GORDY RHYME, MD

## 2023-12-23 ENCOUNTER — Encounter: Payer: Self-pay | Admitting: Internal Medicine

## 2023-12-26 DIAGNOSIS — R7303 Prediabetes: Secondary | ICD-10-CM | POA: Diagnosis not present

## 2023-12-26 DIAGNOSIS — Z0000 Encounter for general adult medical examination without abnormal findings: Secondary | ICD-10-CM | POA: Diagnosis not present

## 2023-12-26 DIAGNOSIS — E7800 Pure hypercholesterolemia, unspecified: Secondary | ICD-10-CM | POA: Diagnosis not present

## 2023-12-31 ENCOUNTER — Ambulatory Visit: Payer: No Typology Code available for payment source | Admitting: Anesthesiology

## 2023-12-31 ENCOUNTER — Other Ambulatory Visit: Payer: Self-pay

## 2023-12-31 ENCOUNTER — Encounter
Admission: RE | Disposition: A | Discharge status: Home / Self Care | Payer: Self-pay | Source: Home / Self Care | Attending: Internal Medicine

## 2023-12-31 ENCOUNTER — Encounter: Payer: Self-pay | Admitting: Internal Medicine

## 2023-12-31 ENCOUNTER — Ambulatory Visit
Admission: RE | Admit: 2023-12-31 | Discharge: 2023-12-31 | Disposition: A | Discharge status: Home / Self Care | Payer: No Typology Code available for payment source | Attending: Internal Medicine | Admitting: Internal Medicine

## 2023-12-31 DIAGNOSIS — Z85038 Personal history of other malignant neoplasm of large intestine: Secondary | ICD-10-CM | POA: Insufficient documentation

## 2023-12-31 DIAGNOSIS — K640 First degree hemorrhoids: Secondary | ICD-10-CM | POA: Diagnosis not present

## 2023-12-31 DIAGNOSIS — D123 Benign neoplasm of transverse colon: Secondary | ICD-10-CM | POA: Diagnosis not present

## 2023-12-31 DIAGNOSIS — I10 Essential (primary) hypertension: Secondary | ICD-10-CM | POA: Insufficient documentation

## 2023-12-31 DIAGNOSIS — Z08 Encounter for follow-up examination after completed treatment for malignant neoplasm: Secondary | ICD-10-CM | POA: Diagnosis not present

## 2023-12-31 DIAGNOSIS — Z800 Family history of malignant neoplasm of digestive organs: Secondary | ICD-10-CM | POA: Diagnosis not present

## 2023-12-31 DIAGNOSIS — K635 Polyp of colon: Secondary | ICD-10-CM | POA: Diagnosis not present

## 2023-12-31 DIAGNOSIS — K6389 Other specified diseases of intestine: Secondary | ICD-10-CM | POA: Diagnosis not present

## 2023-12-31 DIAGNOSIS — Z1211 Encounter for screening for malignant neoplasm of colon: Secondary | ICD-10-CM | POA: Insufficient documentation

## 2023-12-31 DIAGNOSIS — K648 Other hemorrhoids: Secondary | ICD-10-CM | POA: Diagnosis not present

## 2023-12-31 HISTORY — DX: Pure hypercholesterolemia, unspecified: E78.00

## 2023-12-31 HISTORY — DX: Essential (primary) hypertension: I10

## 2023-12-31 HISTORY — DX: Personal history of adenomatous and serrated colon polyps: Z86.0101

## 2023-12-31 HISTORY — PX: COLONOSCOPY WITH PROPOFOL: SHX5780

## 2023-12-31 HISTORY — DX: Prediabetes: R73.03

## 2023-12-31 HISTORY — PX: POLYPECTOMY: SHX5525

## 2023-12-31 HISTORY — DX: Unspecified chronic bronchitis: J42

## 2023-12-31 HISTORY — PX: BIOPSY: SHX5522

## 2023-12-31 SURGERY — COLONOSCOPY WITH PROPOFOL
Anesthesia: General

## 2023-12-31 MED ORDER — EPHEDRINE SULFATE-NACL 50-0.9 MG/10ML-% IV SOSY
PREFILLED_SYRINGE | INTRAVENOUS | Status: DC | PRN
  Administered 2023-12-31: 10 mg via INTRAVENOUS

## 2023-12-31 MED ORDER — PROPOFOL 500 MG/50ML IV EMUL
INTRAVENOUS | Status: DC | PRN
  Administered 2023-12-31: 150 ug/kg/min via INTRAVENOUS

## 2023-12-31 MED ORDER — SODIUM CHLORIDE 0.9 % IV SOLN: INTRAVENOUS | Status: DC

## 2023-12-31 MED ORDER — EPHEDRINE 5 MG/ML INJ
INTRAVENOUS | Status: AC
  Filled 2023-12-31: qty 5

## 2023-12-31 MED ORDER — PROPOFOL 10 MG/ML IV BOLUS
INTRAVENOUS | Status: DC | PRN
  Administered 2023-12-31: 100 mg via INTRAVENOUS

## 2023-12-31 MED ORDER — PROPOFOL 1000 MG/100ML IV EMUL
INTRAVENOUS | Status: AC
  Filled 2023-12-31: qty 100

## 2023-12-31 NOTE — H&P (Signed)
Outpatient short stay form Pre-procedure 12/31/2023 8:33 AM Donald Barker K. Norma Fredrickson, M.D.  Primary Physician: Einar Crow, M.D.  Reason for visit:  Personal history of colon cancer (2006), hx of adenomatous colon polyps  History of present illness:  Donald Barker is established with [facility] for the management of a personal history of colon cancer (01/28/06, moderately differentiated adenocarcinoma of sigmoid colon), adenomatous colon polyps, and a family history of colon cancer (father). All prior office notes, lab results, imaging studies, and procedure reports reviewed as appropriate.     Current Facility-Administered Medications:    0.9 %  sodium chloride infusion, , Intravenous, Continuous, [place], Boykin Nearing, MD, Last Rate: 20 mL/hr at 12/31/23 1610, New Bag at 12/31/23 0723  Medications Prior to Admission  Medication Sig Dispense Refill Last Dose/Taking   ondansetron (ZOFRAN ODT) 4 MG disintegrating tablet Take 1 tablet (4 mg total) by mouth every 8 (eight) hours as needed for nausea or vomiting. (Patient not taking: Reported on 09/12/2022) 20 tablet 0    predniSONE (DELTASONE) 20 MG tablet Take 20 mg by mouth daily as needed.        Allergies  Allergen Reactions   Metoprolol Tartrate Shortness Of Breath     Past Medical History:  Diagnosis Date   Chronic bronchitis (HCC)    History of adenomatous polyp of colon    Hypertension    Pre-diabetes    Pure hypercholesterolemia     Review of systems:  Otherwise negative.    Physical Exam  Gen: Alert, oriented. Appears stated age.  HEENT: [address]/AT. PERRLA. Lungs: CTA, no wheezes. CV: RR nl S1, S2. Abd: soft, benign, no masses. BS+ Ext: No edema. Pulses 2+    Planned procedures: Proceed with colonoscopy. The patient understands the nature of the planned procedure, indications, risks, alternatives and potential complications including but not limited to bleeding, infection, perforation, damage to  internal organs and possible oversedation/side effects from anesthesia. The patient agrees and gives consent to proceed.  Please refer to procedure notes for findings, recommendations and patient disposition/instructions.     Donald Barker K. Norma Fredrickson, M.D. Gastroenterology 12/31/2023  8:33 AM

## 2023-12-31 NOTE — Anesthesia Preprocedure Evaluation (Signed)
Anesthesia Evaluation  Patient identified by MRN, date of birth, ID band Patient awake    Reviewed: Allergy & Precautions, NPO status , Patient's Chart, lab work & pertinent test results  History of Anesthesia Complications Negative for: history of anesthetic complications  Airway Mallampati: II  TM Distance: >3 FB Neck ROM: Full    Dental no notable dental hx. (+) Teeth Intact   Pulmonary neg pulmonary ROS, neg sleep apnea, neg COPD, Patient abstained from smoking.Not current smoker, former smoker   Pulmonary exam normal breath sounds clear to auscultation       Cardiovascular Exercise Tolerance: Good METShypertension, Pt. on medications (-) CAD and (-) Past MI (-) dysrhythmias  Rhythm:Regular Rate:Normal - Systolic murmurs    Neuro/Psych negative neurological ROS  negative psych ROS   GI/Hepatic ,neg GERD  ,,(+)     (-) substance abuse    Endo/Other  neg diabetes    Renal/GU negative Renal ROS     Musculoskeletal   Abdominal   Peds  Hematology   Anesthesia Other Findings Past Medical History: No date: Chronic bronchitis (HCC) No date: History of adenomatous polyp of colon No date: Hypertension No date: Pre-diabetes No date: Pure hypercholesterolemia  Reproductive/Obstetrics                             Anesthesia Physical Anesthesia Plan  ASA: 2  Anesthesia Plan: General   Post-op Pain Management: Minimal or no pain anticipated   Induction: Intravenous  PONV Risk Score and Plan: 2 and Propofol infusion, TIVA and Ondansetron  Airway Management Planned: Nasal Cannula  Additional Equipment: None  Intra-op Plan:   Post-operative Plan:   Informed Consent: I have reviewed the patients History and Physical, chart, labs and discussed the procedure including the risks, benefits and alternatives for the proposed anesthesia with the patient or authorized representative who has  indicated his/her understanding and acceptance.     Dental advisory given  Plan Discussed with: CRNA and Surgeon  Anesthesia Plan Comments: (Discussed risks of anesthesia with patient, including possibility of difficulty with spontaneous ventilation under anesthesia necessitating airway intervention, PONV, and rare risks such as cardiac or respiratory or neurological events, and allergic reactions. Discussed the role of CRNA in patient's perioperative care. Patient understands.)       Anesthesia Quick Evaluation

## 2023-12-31 NOTE — Op Note (Signed)
[facility] Patient Name: Donald Barker Procedure Date: 12/31/2023 7:14 AM MRN: [id number] Account #: [id number] Date of Birth: August 17, 1946 Admit Type: Outpatient Age: 78 Room: [facility] ENDO ROOM 1 Gender: Male Note Status: Finalized Instrument Name: Prentice Docker [id number] Procedure:             Colonoscopy Indications:           High risk colon cancer surveillance: Personal history                         of multiple (3 or more) adenomas, High risk colon                         cancer surveillance: Personal history of colon cancer Providers:             Boykin Nearing. Norma Fredrickson MD, MD Referring MD:          Boykin Nearing. Norma Fredrickson MD, MD (Referring MD), Marya Amsler.                         Dareen Piano MD, MD (Referring MD) Medicines:             Propofol per Anesthesia Complications:         No immediate complications. Estimated blood loss:                         Minimal. Procedure:             Pre-Anesthesia Assessment:                        - The risks and benefits of the procedure and the                         sedation options and risks were discussed with the                         patient. All questions were answered and informed                         consent was obtained.                        - Patient identification and proposed procedure were                         verified prior to the procedure by the nurse. The                         procedure was verified in the procedure room.                        - ASA Grade Assessment: III - A patient with severe                         systemic disease.                        - After reviewing the risks and benefits, the patient  was deemed in satisfactory condition to undergo the                         procedure.                        After obtaining informed consent, the colonoscope was                         passed under direct vision. Throughout the procedure,                          the patient's blood pressure, pulse, and oxygen                         saturations were monitored continuously. The                         Colonoscope was introduced through the anus and                         advanced to the the cecum, identified by appendiceal                         orifice and ileocecal valve. The colonoscopy was                         performed without difficulty. The patient tolerated                         the procedure well. The quality of the bowel                         preparation was good. The ileocecal valve, appendiceal                         orifice, and rectum were photographed. Findings:      The perianal and digital rectal examinations were normal. Pertinent       negatives include normal sphincter tone and no palpable rectal lesions.      Non-bleeding internal hemorrhoids were found during retroflexion. The       hemorrhoids were Grade I (internal hemorrhoids that do not prolapse).      A tattoo was seen in the mid sigmoid colon. The tattoo site appeared       normal. This was biopsied with a cold forceps for histology.      An 11 mm polyp was found in the hepatic flexure. The polyp was       semi-pedunculated. The polyp was removed with a hot snare. Resection and       retrieval were complete.      The exam was otherwise without abnormality. Impression:            - Non-bleeding internal hemorrhoids.                        - A tattoo was seen in the mid sigmoid colon. The                         tattoo site appeared normal. Biopsied.                        -  One 11 mm polyp at the hepatic flexure, removed with                         a hot snare. Resected and retrieved.                        - The examination was otherwise normal. Recommendation:        - Patient has a contact number available for                         emergencies. The signs and symptoms of potential                         delayed complications were discussed with the  patient.                         Return to normal activities tomorrow. Written                         discharge instructions were provided to the patient.                        - Resume previous diet.                        - Continue present medications.                        - Repeat colonoscopy is recommended for surveillance.                         The colonoscopy date will be determined after                         pathology results from today's exam become available                         for review.                        - Return to GI office PRN.                        - The findings and recommendations were discussed with                         the patient. Procedure Code(s):     --- Professional ---                        [phone], Colonoscopy, flexible; with removal of                         tumor(s), polyp(s), or other lesion(s) by snare                         technique                        [id number], 59, Colonoscopy, flexible; with biopsy, single  or multiple Diagnosis Code(s):     --- Professional ---                        D12.3, Benign neoplasm of transverse colon (hepatic                         flexure or splenic flexure)                        K64.0, First degree hemorrhoids                        Z85.038, Personal history of other malignant neoplasm                         of large intestine                        Z86.010, Personal history of colonic polyps CPT copyright 2022 American Medical Association. All rights reserved. The codes documented in this report are preliminary and upon coder review may  be revised to meet current compliance requirements. Stanton Kidney MD, MD 12/31/2023 8:58:25 AM This report has been signed electronically. Number of Addenda: 0 Note Initiated On: 12/31/2023 7:14 AM Scope Withdrawal Time: 0 hours 6 minutes 25 seconds  Total Procedure Duration: 0 hours 11 minutes 4 seconds  Estimated Blood Loss:   Estimated blood loss was minimal.      [facility]

## 2023-12-31 NOTE — Interval H&P Note (Signed)
History and Physical Interval Note:  12/31/2023 8:35 AM  Donald Barker.  has presented today for surgery, with the diagnosis of Z85.038 (ICD-10-CM) - Personal history of colon cancer.  The various methods of treatment have been discussed with the patient and family. After consideration of risks, benefits and other options for treatment, the patient has consented to  Procedure(s): COLONOSCOPY WITH PROPOFOL (N/A) as a surgical intervention.  The patient's history has been reviewed, patient examined, no change in status, stable for surgery.  I have reviewed the patient's chart and labs.  Questions were answered to the patient's satisfaction.     [place], [place]

## 2023-12-31 NOTE — Anesthesia Postprocedure Evaluation (Signed)
Anesthesia Post Note  Patient: Donald Barker.  Procedure(s) Performed: COLONOSCOPY WITH PROPOFOL POLYPECTOMY BIOPSY  Patient location during evaluation: Endoscopy Anesthesia Type: General Level of consciousness: awake and alert Pain management: pain level controlled Vital Signs Assessment: post-procedure vital signs reviewed and stable Respiratory status: spontaneous breathing, nonlabored ventilation, respiratory function stable and patient connected to nasal cannula oxygen Cardiovascular status: blood pressure returned to baseline and stable Postop Assessment: no apparent nausea or vomiting Anesthetic complications: no   There were no known notable events for this encounter.   Last Vitals:  Vitals:   12/31/23 0906 12/31/23 0914  BP: 116/66 112/65  Pulse: 62 61  Resp: 17 16  Temp:    SpO2: 97% 99%    Last Pain:  Vitals:   12/31/23 0914  TempSrc:   PainSc: 0-No pain                 Corinda Gubler

## 2023-12-31 NOTE — Transfer of Care (Signed)
Immediate Anesthesia Transfer of Care Note  Patient: Donald Barker.  Procedure(s) Performed: COLONOSCOPY WITH PROPOFOL POLYPECTOMY  Patient Location: PACU  Anesthesia Type:General  Level of Consciousness: awake and sedated  Airway & Oxygen Therapy: Patient Spontanous Breathing and Patient connected to nasal cannula oxygen  Post-op Assessment: Report given to RN and Post -op Vital signs reviewed and stable  Post vital signs: Reviewed and stable  Last Vitals:  Vitals Value Taken Time  BP    Temp    Pulse    Resp    SpO2      Last Pain:  Vitals:   12/31/23 0721  TempSrc: Temporal  PainSc: 0-No pain         Complications: There were no known notable events for this encounter.

## 2024-01-01 ENCOUNTER — Encounter: Payer: Self-pay | Admitting: Internal Medicine

## 2024-01-02 DIAGNOSIS — E7800 Pure hypercholesterolemia, unspecified: Secondary | ICD-10-CM | POA: Diagnosis not present

## 2024-01-02 DIAGNOSIS — J410 Simple chronic bronchitis: Secondary | ICD-10-CM | POA: Diagnosis not present

## 2024-01-02 DIAGNOSIS — R7303 Prediabetes: Secondary | ICD-10-CM | POA: Diagnosis not present

## 2024-01-02 LAB — SURGICAL PATHOLOGY

## 2024-06-11 DIAGNOSIS — Z20828 Contact with and (suspected) exposure to other viral communicable diseases: Secondary | ICD-10-CM | POA: Diagnosis not present

## 2024-06-30 DIAGNOSIS — E7800 Pure hypercholesterolemia, unspecified: Secondary | ICD-10-CM | POA: Diagnosis not present

## 2024-06-30 DIAGNOSIS — R7303 Prediabetes: Secondary | ICD-10-CM | POA: Diagnosis not present

## 2024-06-30 DIAGNOSIS — J410 Simple chronic bronchitis: Secondary | ICD-10-CM | POA: Diagnosis not present

## 2024-07-07 DIAGNOSIS — K120 Recurrent oral aphthae: Secondary | ICD-10-CM | POA: Diagnosis not present

## 2024-07-07 DIAGNOSIS — Z1331 Encounter for screening for depression: Secondary | ICD-10-CM | POA: Diagnosis not present

## 2024-07-07 DIAGNOSIS — R7303 Prediabetes: Secondary | ICD-10-CM | POA: Diagnosis not present

## 2024-07-07 DIAGNOSIS — Z0000 Encounter for general adult medical examination without abnormal findings: Secondary | ICD-10-CM | POA: Diagnosis not present

## 2024-07-07 DIAGNOSIS — E7800 Pure hypercholesterolemia, unspecified: Secondary | ICD-10-CM | POA: Diagnosis not present

## 2024-07-07 DIAGNOSIS — J410 Simple chronic bronchitis: Secondary | ICD-10-CM | POA: Diagnosis not present

## 2024-07-07 DIAGNOSIS — R002 Palpitations: Secondary | ICD-10-CM | POA: Diagnosis not present

## 2024-08-07 DIAGNOSIS — K120 Recurrent oral aphthae: Secondary | ICD-10-CM | POA: Diagnosis not present

## 2024-08-07 DIAGNOSIS — E7800 Pure hypercholesterolemia, unspecified: Secondary | ICD-10-CM | POA: Diagnosis not present

## 2024-08-07 DIAGNOSIS — R7303 Prediabetes: Secondary | ICD-10-CM | POA: Diagnosis not present

## 2024-08-07 DIAGNOSIS — J410 Simple chronic bronchitis: Secondary | ICD-10-CM | POA: Diagnosis not present

## 2024-08-07 DIAGNOSIS — E038 Other specified hypothyroidism: Secondary | ICD-10-CM | POA: Diagnosis not present

## 2024-08-07 NOTE — Progress Notes (Signed)
 Donald Barker is a 78 y.o. male here for follow up of their medical problems  CHIEF COMPLAINT:  Follow up medical problems in the problem list and as discussed in the history and assessment areas as well as new complaints as listed.   Patient Active Problem List  Diagnosis  . Pure hypercholesterolemia  . Aphthous ulcer  . History of adenomatous polyp of colon  . Health care maintenance  . Prediabetes  . Simple chronic bronchitis (CMS/HHS-HCC)  . Subclinical hypothyroidism     HISTORY OF PRESENT ILLNESS:  Aphthous ulcer Severe with intermittent prednisone  Prediabetes Glucose is followed and controlled on diet   Pure hypercholesterolemia Healthy fat diet is being followed and no myalgia's are noted.   Simple chronic bronchitis (CMS-HCC) Breathing has been essentially at baseline without a recent flair being noted.   Past Medical History:  Diagnosis Date  . Aphthous ulcer 04/12/2015  . Benign essential HTN 08/08/2022  . History of adenomatous polyp of colon   . History of colon cancer 01/28/2006   Moderately differentiated adenocarcinoma of sigmoid colon  . Pure hypercholesterolemia 04/12/2015  . Simple chronic bronchitis (CMS/HHS-HCC) 01/01/2023    Past Surgical History:  Procedure Laterality Date  . COLONOSCOPY  01/28/2005   Adenomatous polyp with high-grade dysplasia  . COLONOSCOPY  01/28/2006   Sigmoid Adenocarcinoma, Adenomatous Polyps, [facility] (Father)  . COLONOSCOPY  05/13/2006   PH CCA, Adenomatous Polyps, [facility] (Father)  . COLONOSCOPY  06/04/2011   PH CCA, PH Adenomatous Polyps, [facility] (Father)  . COLONOSCOPY  05/03/2014   PH CCA, PH Adenomatous Polyps, [facility] (Father): [facility] 05/2018; Recall ltr mailed 03/12/18 (kj)  . COLONOSCOPY  08/11/2018   PH CCA, PH Adenomatous Polyps, [facility] (Father): [facility] 05/2022  . Right knee arthroscopy, partial medial meniscectomy, R knee chondroplasty of medial compartment Right 07/15/2020   Dr. Tobie  . Colon @ [facility]  12/31/2023    Normal colon biopsies/PHx CP/PHx CC/Repeat 19yrs/TKT  . CARDIAC CATHETERIZATION     09/2022  . KNEE ARTHROSCOPY  Feb 2022     No fever chills or sweats   No nausea, vomiting or diarrhea  No chest pain, shortness of breath   Social History   Socioeconomic History  . Marital status: Married  Tobacco Use  . Smoking status: Former    Current packs/day: 0.00    Average packs/day: 1 pack/day for 20.0 years (20.0 ttl pk-yrs)    Types: Cigarettes    Start date: 12/04/1963    Quit date: 10/04/1983    Years since quitting: 40.8  . Smokeless tobacco: Never  Vaping Use  . Vaping status: Never Used  Substance and Sexual Activity  . Alcohol use: Yes    Alcohol/week: 4.0 standard drinks of alcohol    Types: 2 Cans of beer, 2 Standard drinks or equivalent per week    Comment: occasional mixed drink/beer/wine  . Drug use: Never  . Sexual activity: Yes    Partners: Female   Social Drivers of Corporate investment banker Strain: Low Risk  (07/04/2024)   Overall Financial Resource Strain (CARDIA)   . Difficulty of Paying Living Expenses: Not hard at all  Food Insecurity: No Food Insecurity (07/04/2024)   Hunger Vital Sign   . Worried About Programme researcher, broadcasting/film/video in the Last Year: Never true   . Ran Out of Food in the Last Year: Never true  Transportation Needs: No Transportation Needs (07/04/2024)   PRAPARE - Transportation   . Lack of Transportation (Medical):  No   . Lack of Transportation (Non-Medical): No  Housing Stability: Low Risk  (07/04/2024)   Housing Stability Vital Sign   . Unable to Pay for Housing in the Last Year: No   . Number of Times Moved in the Last Year: 0   . Homeless in the Last Year: No      Current Outpatient Medications:  .  predniSONE (DELTASONE) 20 MG tablet, TAKE 1 TABLET (20 MG TOTAL) BY MOUTH AS NEEDED (FOR APTHOUS ULCERS), Disp: 30 tablet, Rfl: 0 .  albuterol  MDI, PROVENTIL , VENTOLIN , PROAIR , HFA 90 mcg/actuation inhaler, Inhale 2 inhalations into the lungs every 4  (four) hours as needed for Wheezing, Disp: 1 each, Rfl: 12  Vitals:   08/07/24 1013  BP: 121/78  Pulse: 64   Body mass index is 23.99 kg/m. No acute distress Lungs; clear to ascultation Heart; Regular rate and rhythm  Abdomen; Soft and flat, normal bowel sounds Extremities; No clubbing, cyanosis or edema  Appointment on 06/30/2024  Component Date Value Ref Range Status  . Glucose 06/30/2024 97  70 - 110 mg/dL Final  . Sodium 92/70/7974 142  136 - 145 mmol/L Final  . Potassium 06/30/2024 4.4  3.6 - 5.1 mmol/L Final  . Chloride 06/30/2024 105  97 - 109 mmol/L Final  . Carbon Dioxide (CO2) 06/30/2024 30.1  22.0 - 32.0 mmol/L Final  . Urea Nitrogen (BUN) 06/30/2024 19  7 - 25 mg/dL Final  . Creatinine 92/70/7974 1.0  0.7 - 1.3 mg/dL Final  . Glomerular Filtration Rate (eGFR) 06/30/2024 77  >60 mL/min/1.73sq m Final  . Calcium 06/30/2024 9.5  8.7 - 10.3 mg/dL Final  . AST  92/70/7974 18  8 - 39 U/L Final  . ALT  06/30/2024 16  6 - 57 U/L Final  . Alk Phos (alkaline Phosphatase) 06/30/2024 60  34 - 104 U/L Final  . Albumin 06/30/2024 4.6  3.5 - 4.8 g/dL Final  . Bilirubin, Total 06/30/2024 1.0  0.3 - 1.2 mg/dL Final  . Protein, Total 06/30/2024 6.3  6.1 - 7.9 g/dL Final  . A/G Ratio 92/70/7974 2.7  1.0 - 5.0 gm/dL Final  . Hemoglobin J8R 06/30/2024 5.6  4.2 - 5.6 % Final  . Average Blood Glucose (Calc) 06/30/2024 114  mg/dL Final  . Bilirubin, Conjugated 06/30/2024 0.21 (H)  0.00 - 0.20 mg/dL Final  . Cholesterol, Total 06/30/2024 157  100 - 200 mg/dL Final  . Triglyceride 92/70/7974 73  35 - 199 mg/dL Final  . HDL (High Density Lipoprotein) Cho* 06/30/2024 60.9  29.0 - 71.0 mg/dL Final  . LDL Calculated 06/30/2024 82  0 - 130 mg/dL Final  . VLDL Cholesterol 06/30/2024 15  mg/dL Final  . Cholesterol/HDL Ratio 06/30/2024 2.6   Final  . Thyroid  Stimulating Hormone (TSH) 06/30/2024 6.313 (H)  0.450-5.330 uIU/ml uIU/mL Final    ASSESSMENT  AND PLAN:  Diagnoses and all orders for  this visit:  Simple chronic bronchitis (CMS/HHS-HCC) Assessment & Plan: Breathing has been essentially at baseline without a recent flair being noted.   Orders: -     Comprehensive Metabolic Panel (CMP); Future -     Hemoglobin A1C; Future -     Lipid Panel w/calc LDL; Future -     Thyroid  Stimulating-Hormone (TSH); Future  Pure hypercholesterolemia Assessment & Plan: Healthy fat diet is being followed and no myalgia's are noted.    Prediabetes Assessment & Plan: Glucose is followed and controlled on diet   Orders: -  Comprehensive Metabolic Panel (CMP); Future -     Hemoglobin A1C; Future -     Lipid Panel w/calc LDL; Future -     Thyroid  Stimulating-Hormone (TSH); Future  Aphthous ulcer Assessment & Plan: Severe with intermittent prednisone   Subclinical hypothyroidism -     Comprehensive Metabolic Panel (CMP); Future -     Hemoglobin A1C; Future -     Lipid Panel w/calc LDL; Future -     Thyroid  Stimulating-Hormone (TSH); Future  Other orders -     albuterol  MDI, PROVENTIL , VENTOLIN , PROAIR , HFA 90 mcg/actuation inhaler; Inhale 2 inhalations into the lungs every 4 (four) hours as needed for Wheezing   Mild copd, cath normal 2023.

## 2024-08-31 DIAGNOSIS — R0609 Other forms of dyspnea: Secondary | ICD-10-CM | POA: Diagnosis not present

## 2024-09-08 ENCOUNTER — Emergency Department

## 2024-09-08 ENCOUNTER — Observation Stay
Admission: EM | Admit: 2024-09-08 | Discharge: 2024-09-09 | Disposition: A | Discharge status: Home / Self Care | Attending: Obstetrics and Gynecology | Admitting: Obstetrics and Gynecology

## 2024-09-08 ENCOUNTER — Other Ambulatory Visit: Payer: Self-pay

## 2024-09-08 DIAGNOSIS — I25110 Atherosclerotic heart disease of native coronary artery with unstable angina pectoris: Secondary | ICD-10-CM | POA: Insufficient documentation

## 2024-09-08 DIAGNOSIS — Z79899 Other long term (current) drug therapy: Secondary | ICD-10-CM | POA: Insufficient documentation

## 2024-09-08 DIAGNOSIS — F1090 Alcohol use, unspecified, uncomplicated: Secondary | ICD-10-CM | POA: Diagnosis not present

## 2024-09-08 DIAGNOSIS — E785 Hyperlipidemia, unspecified: Secondary | ICD-10-CM | POA: Diagnosis not present

## 2024-09-08 DIAGNOSIS — I10 Essential (primary) hypertension: Secondary | ICD-10-CM | POA: Diagnosis not present

## 2024-09-08 DIAGNOSIS — R0789 Other chest pain: Secondary | ICD-10-CM | POA: Diagnosis not present

## 2024-09-08 DIAGNOSIS — Z7982 Long term (current) use of aspirin: Secondary | ICD-10-CM | POA: Diagnosis not present

## 2024-09-08 DIAGNOSIS — Z87891 Personal history of nicotine dependence: Secondary | ICD-10-CM | POA: Diagnosis not present

## 2024-09-08 DIAGNOSIS — Z7901 Long term (current) use of anticoagulants: Secondary | ICD-10-CM | POA: Diagnosis not present

## 2024-09-08 DIAGNOSIS — R0600 Dyspnea, unspecified: Secondary | ICD-10-CM | POA: Diagnosis not present

## 2024-09-08 DIAGNOSIS — R079 Chest pain, unspecified: Principal | ICD-10-CM

## 2024-09-08 DIAGNOSIS — I2089 Other forms of angina pectoris: Secondary | ICD-10-CM | POA: Diagnosis not present

## 2024-09-08 DIAGNOSIS — I200 Unstable angina: Secondary | ICD-10-CM | POA: Insufficient documentation

## 2024-09-08 DIAGNOSIS — R0602 Shortness of breath: Secondary | ICD-10-CM | POA: Diagnosis not present

## 2024-09-08 LAB — CBC
HCT: 31.4 % — ABNORMAL LOW (ref 39.0–52.0)
Hemoglobin: 10.8 g/dL — ABNORMAL LOW (ref 13.0–17.0)
MCH: 35.6 pg — ABNORMAL HIGH (ref 26.0–34.0)
MCHC: 34.4 g/dL (ref 30.0–36.0)
MCV: 103.6 fL — ABNORMAL HIGH (ref 80.0–100.0)
Platelets: 233 K/uL (ref 150–400)
RBC: 3.03 MIL/uL — ABNORMAL LOW (ref 4.22–5.81)
RDW: 17.7 % — ABNORMAL HIGH (ref 11.5–15.5)
WBC: 4.8 K/uL (ref 4.0–10.5)
nRBC: 1.7 % — ABNORMAL HIGH (ref 0.0–0.2)

## 2024-09-08 LAB — BASIC METABOLIC PANEL WITH GFR
Anion gap: 12 (ref 5–15)
BUN: 25 mg/dL — ABNORMAL HIGH (ref 8–23)
CO2: 22 mmol/L (ref 22–32)
Calcium: 9.3 mg/dL (ref 8.9–10.3)
Chloride: 102 mmol/L (ref 98–111)
Creatinine, Ser: 1.21 mg/dL (ref 0.61–1.24)
GFR, Estimated: >60 mL/min (ref >60)
Glucose, Bld: 161 mg/dL — ABNORMAL HIGH (ref 70–99)
Potassium: 3.6 mmol/L (ref 3.5–5.1)
Sodium: 136 mmol/L (ref 135–145)

## 2024-09-08 LAB — TROPONIN I (HIGH SENSITIVITY)
Troponin I (High Sensitivity): 6 ng/L (ref <18)
Troponin I (High Sensitivity): 12 ng/L (ref <18)
Troponin I (High Sensitivity): 13 ng/L (ref <18)

## 2024-09-08 LAB — PROTIME-INR
INR: 1 (ref 0.8–1.2)
Prothrombin Time: 14 s (ref 11.4–15.2)

## 2024-09-08 LAB — APTT: aPTT: 33 s (ref 24–36)

## 2024-09-08 LAB — HEPARIN LEVEL (UNFRACTIONATED): Heparin Unfractionated: 0.24 [IU]/mL — ABNORMAL LOW (ref 0.30–0.70)

## 2024-09-08 MED ORDER — HEPARIN BOLUS VIA INFUSION
1100.0000 [IU] | Freq: Once | INTRAVENOUS | Status: AC
  Administered 2024-09-08: 1100 [IU] via INTRAVENOUS
  Filled 2024-09-08: qty 1100

## 2024-09-08 MED ORDER — LISINOPRIL 10 MG PO TABS
20.0000 mg | ORAL_TABLET | Freq: Every day | ORAL | Status: DC
  Administered 2024-09-08 – 2024-09-09 (×2): 20 mg via ORAL
  Filled 2024-09-08 (×2): qty 2

## 2024-09-08 MED ORDER — HEPARIN (PORCINE) 25000 UT/250ML-% IV SOLN
1100.0000 [IU]/h | INTRAVENOUS | Status: DC
  Administered 2024-09-08: 900 [IU]/h via INTRAVENOUS
  Administered 2024-09-09: 1100 [IU]/h via INTRAVENOUS
  Filled 2024-09-08 (×2): qty 250

## 2024-09-08 MED ORDER — ONDANSETRON HCL 4 MG/2ML IJ SOLN: 4.0000 mg | Freq: Four times a day (QID) | INTRAMUSCULAR | Status: DC | PRN

## 2024-09-08 MED ORDER — HEPARIN BOLUS VIA INFUSION
4000.0000 [IU] | Freq: Once | INTRAVENOUS | Status: AC
  Administered 2024-09-08: 4000 [IU] via INTRAVENOUS
  Filled 2024-09-08: qty 4000

## 2024-09-08 MED ORDER — HYDROCHLOROTHIAZIDE 12.5 MG PO TABS
12.5000 mg | ORAL_TABLET | Freq: Every day | ORAL | Status: DC
  Administered 2024-09-08 – 2024-09-09 (×2): 12.5 mg via ORAL
  Filled 2024-09-08 (×2): qty 1

## 2024-09-08 MED ORDER — ASPIRIN 81 MG PO TBEC
81.0000 mg | DELAYED_RELEASE_TABLET | Freq: Every day | ORAL | Status: DC
  Administered 2024-09-09: 81 mg via ORAL
  Filled 2024-09-08: qty 1

## 2024-09-08 MED ORDER — ACETAMINOPHEN 325 MG PO TABS: 650.0000 mg | ORAL_TABLET | ORAL | Status: DC | PRN

## 2024-09-08 MED ORDER — ASPIRIN 81 MG PO CHEW
324.0000 mg | CHEWABLE_TABLET | Freq: Once | ORAL | Status: AC
  Administered 2024-09-08: 324 mg via ORAL
  Filled 2024-09-08: qty 4

## 2024-09-08 MED ORDER — HYDRALAZINE HCL 20 MG/ML IJ SOLN: 5.0000 mg | Freq: Four times a day (QID) | INTRAMUSCULAR | Status: DC | PRN

## 2024-09-08 MED ORDER — NITROGLYCERIN 0.4 MG SL SUBL
0.4000 mg | SUBLINGUAL_TABLET | SUBLINGUAL | Status: DC | PRN
  Filled 2024-09-08: qty 1

## 2024-09-08 NOTE — Consult Note (Signed)
 PHARMACY - ANTICOAGULATION CONSULT NOTE  Pharmacy Consult for Heparin  Indication: chest pain/ACS  Allergies  Allergen Reactions   Metoprolol Tartrate Shortness Of Breath    Patient Measurements: Height: 5' 10 (177.8 cm) Weight: 74.8 kg (165 lb) IBW/kg (Calculated) : 73 HEPARIN  DW (KG): 74.8  Vital Signs: Temp: 98 F (36.7 C) (10/07 2000) Temp Source: Oral (10/07 1545) BP: 108/58 (10/07 2200) Pulse Rate: 61 (10/07 2200)  Labs: Recent Labs    09/08/24 1136 09/08/24 1318 09/08/24 1727 09/08/24 2138  HGB 10.8*  --   --   --   HCT 31.4*  --   --   --   PLT 233  --   --   --   APTT  --  33  --   --   LABPROT  --  14.0  --   --   INR  --  1.0  --   --   HEPARINUNFRC  --   --   --  0.24*  CREATININE 1.21  --   --   --   TROPONINIHS 6 12 13   --     Estimated Creatinine Clearance: 52 mL/min (by C-G formula based on SCr of 1.21 mg/dL).   Medical History: Past Medical History:  Diagnosis Date   Chronic bronchitis (HCC)    History of adenomatous polyp of colon    Hypertension    Pre-diabetes    Pure hypercholesterolemia     Medications:  No history of chronic anticoagulation PTA  Assessment: 78 y.o. male with medical history significant of HTN, HLD, prediabetes presented with worsening chest pains. Troponin level of 6, unremarkable. Pharmacy has been consulted to dose and monitor heparin  infusion.  Baseline labs: Hgb 10.8, Plts 233, INR and aPTT pending  Goal of Therapy:  Heparin  level 0.3-0.7 units/ml Monitor platelets by anticoagulation protocol: Yes   Plan:  10/7:  HL @ 2138 = 0.24, SUBtherapeutic - will order heparin  1100 units IV X 1 bolus and increase drip rate to 1100 units/hr - recheck HL 8 hrs after rate change  Continue to monitor H&H and platelets  Libby Goehring D 09/08/2024,10:49 PM

## 2024-09-08 NOTE — Consult Note (Signed)
 PHARMACY - ANTICOAGULATION CONSULT NOTE  Pharmacy Consult for Heparin  Indication: chest pain/ACS  Allergies  Allergen Reactions   Metoprolol Tartrate Shortness Of Breath    Patient Measurements: Height: 5' 10 (177.8 cm) Weight: 74.8 kg (165 lb) IBW/kg (Calculated) : 73 HEPARIN  DW (KG): 74.8  Vital Signs: Temp: 97.9 F (36.6 C) (10/07 1133) Temp Source: Oral (10/07 1133) BP: 168/64 (10/07 1201) Pulse Rate: 75 (10/07 1201)  Labs: Recent Labs    09/08/24 1136  HGB 10.8*  HCT 31.4*  PLT 233  CREATININE 1.21  TROPONINIHS 6    Estimated Creatinine Clearance: 52 mL/min (by C-G formula based on SCr of 1.21 mg/dL).   Medical History: Past Medical History:  Diagnosis Date   Chronic bronchitis (HCC)    History of adenomatous polyp of colon    Hypertension    Pre-diabetes    Pure hypercholesterolemia     Medications:  No history of chronic anticoagulation PTA  Assessment: 78 y.o. male with medical history significant of HTN, HLD, prediabetes presented with worsening chest pains. Troponin level of 6, unremarkable. Pharmacy has been consulted to dose and monitor heparin  infusion.  Baseline labs: Hgb 10.8, Plts 233, INR and aPTT pending  Goal of Therapy:  Heparin  level 0.3-0.7 units/ml Monitor platelets by anticoagulation protocol: Yes   Plan:  Give 4000 units bolus x 1 Start heparin  infusion at 900 units/hr Check anti-Xa level in 8 hours and daily while on heparin  Continue to monitor H&H and platelets  Derita Michelsen A Mackson Botz 09/08/2024,1:09 PM

## 2024-09-08 NOTE — ED Triage Notes (Signed)
 Patient states chest pain and shortness of breath since this morning; states he feels like his heart is skipping a beat.

## 2024-09-08 NOTE — ED Provider Notes (Signed)
 [facility] Provider Note    Event Date/Time   First MD Initiated Contact with Patient 09/08/24 1144     (approximate)   History   Chest Pain   HPI  Donald Barker. is a 78 y.o. male history of high cholesterol, hypothyroidism prediabetes per PCP note on September 5.  Recent diagnosis of chronic bronchitis about a month ago   Patient relates that he has had a few episodes of chest pressure that will come and go.   1 hour ago started having heavy chest pressure feeling short of breath.  It is located over the left chest.  Physical Exam   Triage Vital Signs: ED Triage Vitals  Encounter Vitals Group     BP 09/08/24 1134 (!) 180/73     Girls Systolic BP Percentile --      Girls Diastolic BP Percentile --      Boys Systolic BP Percentile --      Boys Diastolic BP Percentile --      Pulse Rate 09/08/24 1133 79     Resp 09/08/24 1133 20     Temp 09/08/24 1133 97.9 F (36.6 C)     Temp Source 09/08/24 1133 Oral     SpO2 09/08/24 1133 100 %     Weight 09/08/24 1133 165 lb (74.8 kg)     Height 09/08/24 1133 5' 10 (1.778 m)     Head Circumference --      Peak Flow --      Pain Score 09/08/24 1133 1     Pain Loc --      Pain Education --      Exclude from Growth Chart --     Most recent vital signs: Vitals:   09/08/24 1134 09/08/24 1201  BP: (!) 180/73 (!) 168/64  Pulse:  75  Resp:  (!) 22  Temp:    SpO2:  100%     General: Awake, no distress.  Appears slightly diaphoretic.  Appears to be having active chest pain CV:  Good peripheral perfusion.  Normal tones and rate Resp:  Normal effort.  Clear bilateral with normal work of breathing Abd:  No distention.  Abdomen soft nontender nondistended throughout Other:  No lower extremity edema venous cords or congestion  Patient denies any history of blood clots, no recent surgeries.  Had a stress test that he was told was abnormal a few days ago he was having similar symptoms but they were  not near as intense   ED Results / Procedures / Treatments   Labs (all labs ordered are listed, but only abnormal results are displayed) Labs Reviewed  BASIC METABOLIC PANEL WITH GFR - Abnormal; Notable for the following components:      Result Value   Glucose, Bld 161 (*)    BUN 25 (*)    All other components within normal limits  CBC - Abnormal; Notable for the following components:   RBC 3.03 (*)    Hemoglobin 10.8 (*)    HCT 31.4 (*)    MCV 103.6 (*)    MCH 35.6 (*)    RDW 17.7 (*)    nRBC 1.7 (*)    All other components within normal limits  TROPONIN I (HIGH SENSITIVITY)     EKG  Inter by me at 1132 heart rate 90 QRS 100 QTc 480 Sinus rhythm with occasional artifact and PVC.  ST segment abnormality including depressions noted in the lateral precordial and inferior leads.  Does not meet frank STEMI criteria, but concern for acute ACS is high.  Plan to repeat ECG.  At the time I reviewed this I also contacted our triage nurse and asked the patient be prioritized to him inside treatment bed as soon as possible   RADIOLOGY  No results found.    PROCEDURES:  Critical Care performed: Yes, see critical care procedure note(s)  CRITICAL CARE Performed by: Oneil Budge   Total critical care time: 35 minutes  Critical care time was exclusive of separately billable procedures and treating other patients.  Critical care was necessary to treat or prevent imminent or life-threatening deterioration.  Critical care was time spent personally by me on the following activities: development of treatment plan with patient and/or surrogate as well as nursing, discussions with consultants, evaluation of patient's response to treatment, examination of patient, obtaining history from patient or surrogate, ordering and performing treatments and interventions, ordering and review of laboratory studies, ordering and review of radiographic studies, pulse oximetry and re-evaluation of  patient's condition.   Procedures   MEDICATIONS ORDERED IN ED: Medications  nitroGLYCERIN (NITROSTAT) SL tablet 0.4 mg (has no administration in time range)  aspirin  chewable tablet 324 mg (324 mg Oral Given 09/08/24 1156)     IMPRESSION / MDM / ASSESSMENT AND PLAN / ED COURSE  I reviewed the triage vital signs and the nursing notes.                              Differential diagnosis includes, but is not limited to, ACS, aortic dissection, pulmonary embolism, cardiac tamponade, pneumothorax, pneumonia, pericarditis, myocarditis, GI-related causes including esophagitis/gastritis, and musculoskeletal chest wall pain.     Patient's presentation is most consistent with acute presentation with potential threat to life or bodily function.   The patient is on the cardiac monitor to evaluate for evidence of arrhythmia and/or significant heart rate changes.   Clinical Course as of 09/08/24 1235  Tue Sep 08, 2024  1153 Contacted Dr. Mady, cardiology consult requested for concerns of ACS or potential acute obstructive MI. [MQ]  1205 Chest x-ray inter by me as clear.  Pending radiology read.  No mediastinal widening.  Patient does not have any ripping tearing or moving pain or symptoms that would be highly concerning for dissection.  No hypoxia no tachycardia, no noted risk factors for thromboembolism.  Primary concern at this time is for ACS.  Awaiting troponin cardiology consult.  Nitrates and aspirin  [MQ]  1217 Patient reports pain and symptom-free.  Resting comfortably now. [MQ]  1221 Initial troponin normal, however given his symptoms presentation abnormal ECG I certainly think he is at high risk for ACS.  Will defer to cardiology on determination of whether or not he would benefit from further anticoagulation or heparin .  At this juncture he is now pain and symptom-free.  He is understanding of the need for hospitalization and agreeable.  Will admit with cardiology consultation pending [MQ]     Clinical Course User Index [MQ] Budge Oneil, MD   Patient admitted to hospitalist service under the care of Dr. SHAUNNA Mana.   FINAL CLINICAL IMPRESSION(S) / ED DIAGNOSES   Final diagnoses:  Chest pain with high risk for cardiac etiology  Unstable angina (HCC)     Rx / DC Orders   ED Discharge Orders     None        Note:  This document was prepared using Dragon voice  recognition software and may include unintentional dictation errors.   Dicky Anes, MD 09/08/24 1235

## 2024-09-08 NOTE — Consult Note (Signed)
 [facility] CLINIC CARDIOLOGY CONSULT NOTE       Patient ID: Donald Barker. MRN: [id number] DOB/AGE: 1946/09/11 78 y.o.  Admit date: 09/08/2024 Referring Physician Dr. Oneil Budge Primary Physician Lenon Layman ORN, MD  Primary Cardiologist Dr. Hester (last seen 2023) Reason for Consultation chest pain, abnormal EKG  HPI: Donald Barker. is a 78 y.o. male  with a past medical history of hypertension, hyperlipidemia, pre-diabetes, hx colon cancer, asthma who presented to the ED on 09/08/2024 for chest pain, SOB, palpitations. Cardiology was consulted for further evaluation.   Patient reports that earlier today he began experiencing significant shortness of breath and chest tightness.  Given this he decided to come to the ED for further evaluation.  Workup in the ED notable for creatinine 1.21, potassium 3.6, hemoglobin 10.8, WBC 4.8.  Troponins trended 6 > 12. EKG in the ED normal sinus rhythm with PVC, ST depression in inferior leads as well as minimal ST elevation in aVR. CXR in the ED without acute abnormality.   At the time my evaluation this afternoon patient is resting comfortably in ED stretcher with wife at bedside.  We discussed his symptoms in further detail.  He states that he has had intermittent episodes of chest pain and shortness of breath for the last few years.  These are typically associated with exertion and resolves with rest.  Because of this he underwent stress testing and heart catheterization in 2023 which did not demonstrate any obstructive coronary disease.  He also follows with pulmonology regarding his shortness of breath symptoms.  He states that the episode that he experienced today was more significant and he reports that he felt like he could not breathe.  This episode lasted for 2 hours which was longer than usual episodes.  He states that prior to his heart catheterization in 2023 his symptoms were relatively similar but today was much worse than usual.   Overall he is feeling better this afternoon.  Review of systems complete and found to be negative unless listed above    Past Medical History:  Diagnosis Date   Chronic bronchitis (HCC)    History of adenomatous polyp of colon    Hypertension    Pre-diabetes    Pure hypercholesterolemia     Past Surgical History:  Procedure Laterality Date   BIOPSY  12/31/2023   Procedure: BIOPSY;  Surgeon: Aundria, Ladell POUR, MD;  Location: [facility] ENDOSCOPY;  Service: Gastroenterology;;   CARDIAC CATHETERIZATION     COLONOSCOPY WITH PROPOFOL  N/A 08/11/2018   Procedure: COLONOSCOPY WITH PROPOFOL ;  Surgeon: Viktoria Lamar DASEN, MD;  Location: [facility] ENDOSCOPY;  Service: Endoscopy;  Laterality: N/A;   COLONOSCOPY WITH PROPOFOL  N/A 12/31/2023   Procedure: COLONOSCOPY WITH PROPOFOL ;  Surgeon: Toledo, Ladell POUR, MD;  Location: [facility] ENDOSCOPY;  Service: Gastroenterology;  Laterality: N/A;   KNEE ARTHROSCOPY Right 12/30/2020   Procedure: Right kne arthroscopic partial synovectomy;  Surgeon: Tobie Priest, MD;  Location: [facility];  Service: Orthopedics;  Laterality: Right;   KNEE ARTHROSCOPY WITH MEDIAL MENISECTOMY Right 07/15/2020   Procedure: Right knee arthroscopic partial medial meniscectomy;  Surgeon: Tobie Priest, MD;  Location: [facility] ORS;  Service: Orthopedics;  Laterality: Right;   LEFT HEART CATH AND CORONARY ANGIOGRAPHY N/A 09/12/2022   Procedure: LEFT HEART CATH AND CORONARY ANGIOGRAPHY;  Surgeon: Hester Wolm PARAS, MD;  Location: [facility] INVASIVE CV LAB;  Service: Cardiovascular;  Laterality: N/A;   POLYPECTOMY  12/31/2023   Procedure: POLYPECTOMY;  Surgeon: Aundria, Teodoro K, MD;  Location: [facility] ENDOSCOPY;  Service: Gastroenterology;;    (Not in a hospital admission)  Social History   Socioeconomic History   Marital status: Married    Spouse name: Not on file   Number of children: Not on file   Years of education: Not on file   Highest education level: Not on file  Occupational History    Not on file  Tobacco Use   Smoking status: Former    Current packs/day: 0.00    Types: Cigarettes    Quit date: 08/11/1984    Years since quitting: 40.1   Smokeless tobacco: Never  Vaping Use   Vaping status: Never Used  Substance and Sexual Activity   Alcohol use: Yes    Alcohol/week: 2.0 standard drinks of alcohol    Types: 1 Cans of beer, 1 Glasses of wine per week   Drug use: Never   Sexual activity: Not on file  Other Topics Concern   Not on file  Social History Narrative   Lives at home with wife   Social Drivers of Health   Financial Resource Strain: Low Risk  (07/04/2024)   Received from [facility]   Overall Financial Resource Strain (CARDIA)    Difficulty of Paying Living Expenses: Not hard at all  Food Insecurity: No Food Insecurity (07/04/2024)   Received from [facility]   Hunger Vital Sign    Within the past 12 months, you worried that your food would run out before you got the money to buy more.: Never true    Within the past 12 months, the food you bought just didn't last and you didn't have money to get more.: Never true  Transportation Needs: No Transportation Needs (07/04/2024)   Received from [facility] - Transportation    In the past 12 months, has lack of transportation kept you from medical appointments or from getting medications?: No    Lack of Transportation (Non-Medical): No  Physical Activity: Not on file  Stress: Not on file  Social Connections: Not on file  Intimate Partner Violence: Not on file    History reviewed. No pertinent family history.   Vitals:   09/08/24 1133 09/08/24 1134  BP:  (!) 180/73  Pulse: 79   Resp: 20   Temp: 97.9 F (36.6 C)   TempSrc: Oral   SpO2: 100%   Weight: 74.8 kg   Height: 5' 10 (1.778 m)     PHYSICAL EXAM General: Well-appearing elderly male, well nourished, in no acute distress. HEENT: Normocephalic and atraumatic. Neck: No JVD.  Lungs:  Normal respiratory effort on room air. Clear bilaterally to auscultation. No wheezes, crackles, rhonchi.  Heart: HRRR. Normal S1 and S2 without gallops or murmurs.  Abdomen: Non-distended appearing.  Msk: Normal strength and tone for age. Extremities: Warm and well perfused. No clubbing, cyanosis.  No edema.  Neuro: Alert and oriented X 3. Psych: Answers questions appropriately.   Labs: Basic Metabolic Panel: No results for input(s): NA, K, CL, CO2, GLUCOSE, BUN, CREATININE, CALCIUM, MG, PHOS in the last 72 hours. Liver Function Tests: No results for input(s): AST, ALT, ALKPHOS, BILITOT, PROT, ALBUMIN in the last 72 hours. No results for input(s): LIPASE, AMYLASE in the last 72 hours. CBC: Recent Labs    09/08/24 1136  WBC 4.8  HGB 10.8*  HCT 31.4*  MCV 103.6*  PLT 233   Cardiac Enzymes: No results for input(s): CKTOTAL, CKMB, CKMBINDEX, TROPONINIHS in the last 72  hours. BNP: No results for input(s): BNP in the last 72 hours. D-Dimer: No results for input(s): DDIMER in the last 72 hours. Hemoglobin A1C: No results for input(s): HGBA1C in the last 72 hours. Fasting Lipid Panel: No results for input(s): CHOL, HDL, LDLCALC, TRIG, CHOLHDL, LDLDIRECT in the last 72 hours. Thyroid  Function Tests: No results for input(s): TSH, T4TOTAL, T3FREE, THYROIDAB in the last 72 hours.  Invalid input(s): FREET3 Anemia Panel: No results for input(s): VITAMINB12, FOLATE, FERRITIN, TIBC, IRON, RETICCTPCT in the last 72 hours.   Radiology: No results found.  ECHO ordered  TELEMETRY reviewed by me 09/08/2024: Sinus rhythm rate 60s  EKG reviewed by me: normal sinus rhythm with PVC, ST depression in inferior leads as well as minimal ST elevation in aVR  Data reviewed by me 09/08/2024: last 24h vitals tele labs imaging I/O ED provider note, admission H&P  Active Problems:   * No active hospital problems. *     ASSESSMENT AND PLAN:  Kort Stettler. is a 78 y.o. male  with a past medical history of hypertension, hyperlipidemia, pre-diabetes, hx colon cancer, asthma who presented to the ED on 09/08/2024 for chest pain, SOB, palpitations. Cardiology was consulted for further evaluation.   # Chest pain # Shortness of breath # Hypertension Patient presented with episode of chest tightness and shortness of breath which lasted for roughly 2 hours this morning and is now resolved.  Troponins normal x 2 at 6, 12.  EKG with normal ST elevation in aVR and ST depression noted in inferior leads.  Recently had a stress echo at PCP office which revealed inferior hypokinesis at peak stress, EF preserved on rest images. -Echo ordered.  Plan for nuclear stress test tomorrow for additional evaluation.  N.p.o. at midnight. -S/p ASA 325 mg in the ED. Continue aspirin  81 mg daily. -Heparin  was ordered by primary team. -Lisinopril 20 mg daily for BP control.   This patient's plan of care was discussed and created with Dr. Ammon and he is in agreement.  Signed: Danita Bloch, PA-C  09/08/2024, 12:00 PM [facility]

## 2024-09-08 NOTE — H&P (Signed)
 History and Physical    Donald Barker. FMW:[id number] DOB: 10-23-1946 DOA: 09/08/2024  PCP: Lenon Layman ORN, MD (Confirm with patient/family/NH records and if not entered, this has to be entered at [facility] point of entry) Patient coming from: Home  I have personally briefly reviewed patient's old medical records in [facility]  Chief Complaint: Chest pain  HPI: Donald Barker. is a 78 y.o. male with medical history significant of HTN, HLD, prediabetes presented with worsening chest pains.  Patient has had on and off chest pains for last 4+ years for which she has been followed by cardiology.  He described the chest pain usually elicited by activity, relieved by resting.  Associated with palpitations and feeling nausea sometimes.  Denied any nocturnal pain.  He underwent stress echo last week, which showed abnormal EKG response as well as hypokinesis of anterior and inferior wall.  Today he came in with severe episode of chest pain lasted for more than an hour pressure-like, associated with palpitations.  Chest pain subsided en route to ED, no nitro was given.  ED Course: Afebrile, blood pressure elevated 180/70 O2 saturation 100% room air.  EKG showed ST depression on lead II lead III and aVF.  Troponin.  Negative.  Review of Systems: As per HPI otherwise 14 point review of systems negative.    Past Medical History:  Diagnosis Date   Chronic bronchitis (HCC)    History of adenomatous polyp of colon    Hypertension    Pre-diabetes    Pure hypercholesterolemia     Past Surgical History:  Procedure Laterality Date   BIOPSY  12/31/2023   Procedure: BIOPSY;  Surgeon: Aundria, Ladell POUR, MD;  Location: [facility] ENDOSCOPY;  Service: Gastroenterology;;   CARDIAC CATHETERIZATION     COLONOSCOPY WITH PROPOFOL  N/A 08/11/2018   Procedure: COLONOSCOPY WITH PROPOFOL ;  Surgeon: Viktoria Lamar DASEN, MD;  Location: [facility] ENDOSCOPY;  Service: Endoscopy;  Laterality: N/A;   COLONOSCOPY WITH  PROPOFOL  N/A 12/31/2023   Procedure: COLONOSCOPY WITH PROPOFOL ;  Surgeon: Toledo, Ladell POUR, MD;  Location: [facility] ENDOSCOPY;  Service: Gastroenterology;  Laterality: N/A;   KNEE ARTHROSCOPY Right 12/30/2020   Procedure: Right kne arthroscopic partial synovectomy;  Surgeon: Tobie Priest, MD;  Location: [facility];  Service: Orthopedics;  Laterality: Right;   KNEE ARTHROSCOPY WITH MEDIAL MENISECTOMY Right 07/15/2020   Procedure: Right knee arthroscopic partial medial meniscectomy;  Surgeon: Tobie Priest, MD;  Location: [facility] ORS;  Service: Orthopedics;  Laterality: Right;   LEFT HEART CATH AND CORONARY ANGIOGRAPHY N/A 09/12/2022   Procedure: LEFT HEART CATH AND CORONARY ANGIOGRAPHY;  Surgeon: Hester Wolm PARAS, MD;  Location: [facility] INVASIVE CV LAB;  Service: Cardiovascular;  Laterality: N/A;   POLYPECTOMY  12/31/2023   Procedure: POLYPECTOMY;  Surgeon: Toledo, Teodoro K, MD;  Location: [facility] ENDOSCOPY;  Service: Gastroenterology;;     reports that he quit smoking about 40 years ago. His smoking use included cigarettes. He has never used smokeless tobacco. He reports current alcohol use of about 2.0 standard drinks of alcohol per week. He reports that he does not use drugs.  Allergies  Allergen Reactions   Metoprolol Tartrate Shortness Of Breath    History reviewed. No pertinent family history.   Prior to Admission medications   Medication Sig Start Date End Date Taking? Authorizing Provider  albuterol  (VENTOLIN  HFA) 108 (90 Base) MCG/ACT inhaler Inhale 2 puffs into the lungs every 4 (four) hours as needed for wheezing. 08/07/24 08/07/25 Yes [provider]  predniSONE (DELTASONE) 20 MG tablet Take 20 mg by mouth daily as needed.    [provider]    Physical Exam: Vitals:   09/08/24 1133 09/08/24 1134 09/08/24 1201  BP:  (!) 180/73 (!) 168/64  Pulse: 79  75  Resp: 20  (!) 22  Temp: 97.9 F (36.6 C)    TempSrc: Oral    SpO2: 100%  100%  Weight: 74.8 kg    Height:  5' 10 (1.778 m)      Constitutional: NAD, calm, comfortable Vitals:   09/08/24 1133 09/08/24 1134 09/08/24 1201  BP:  (!) 180/73 (!) 168/64  Pulse: 79  75  Resp: 20  (!) 22  Temp: 97.9 F (36.6 C)    TempSrc: Oral    SpO2: 100%  100%  Weight: 74.8 kg    Height: 5' 10 (1.778 m)     Eyes: PERRL, lids and conjunctivae normal ENMT: Mucous membranes are moist. Posterior pharynx clear of any exudate or lesions.Normal dentition.  Neck: normal, supple, no masses, no thyromegaly Respiratory: clear to auscultation bilaterally, no wheezing, no crackles. Normal respiratory effort. No accessory muscle use.  Cardiovascular: Regular rate and rhythm, no murmurs / rubs / gallops. No extremity edema. 2+ pedal pulses. No carotid bruits.  Abdomen: no tenderness, no masses palpated. No hepatosplenomegaly. Bowel sounds positive.  Musculoskeletal: no clubbing / cyanosis. No joint deformity upper and lower extremities. Good ROM, no contractures. Normal muscle tone.  Skin: no rashes, lesions, ulcers. No induration Neurologic: CN 2-12 grossly intact. Sensation intact, DTR normal. Strength 5/5 in all 4.  Psychiatric: Normal judgment and insight. Alert and oriented x 3. Normal mood.     Labs on Admission: I have personally reviewed following labs and imaging studies  CBC: Recent Labs  Lab 09/08/24 1136  WBC 4.8  HGB 10.8*  HCT 31.4*  MCV 103.6*  PLT 233   Basic Metabolic Panel: Recent Labs  Lab 09/08/24 1136  NA 136  K 3.6  CL 102  CO2 22  GLUCOSE 161*  BUN 25*  CREATININE 1.21  CALCIUM 9.3   GFR: Estimated Creatinine Clearance: 52 mL/min (by C-G formula based on SCr of 1.21 mg/dL). Liver Function Tests: No results for input(s): AST, ALT, ALKPHOS, BILITOT, PROT, ALBUMIN in the last 168 hours. No results for input(s): LIPASE, AMYLASE in the last 168 hours. No results for input(s): AMMONIA in the last 168 hours. Coagulation Profile: No results for input(s): INR,  PROTIME in the last 168 hours. Cardiac Enzymes: No results for input(s): CKTOTAL, CKMB, CKMBINDEX, TROPONINI in the last 168 hours. BNP (last 3 results) No results for input(s): PROBNP in the last 8760 hours. HbA1C: No results for input(s): HGBA1C in the last 72 hours. CBG: No results for input(s): GLUCAP in the last 168 hours. Lipid Profile: No results for input(s): CHOL, HDL, LDLCALC, TRIG, CHOLHDL, LDLDIRECT in the last 72 hours. Thyroid  Function Tests: No results for input(s): TSH, T4TOTAL, FREET4, T3FREE, THYROIDAB in the last 72 hours. Anemia Panel: No results for input(s): VITAMINB12, FOLATE, FERRITIN, TIBC, IRON, RETICCTPCT in the last 72 hours. Urine analysis: No results found for: COLORURINE, APPEARANCEUR, LABSPEC, PHURINE, GLUCOSEU, HGBUR, BILIRUBINUR, KETONESUR, PROTEINUR, UROBILINOGEN, NITRITE, LEUKOCYTESUR  Radiological Exams on Admission: No results found.  EKG: Independently reviewed.  Sinus rhythm, ST depression on lead 2, 3 and aVF  Assessment/Plan Principal Problem:   Angina at rest Active Problems:   Unstable angina (HCC)  (please populate well all problems here in Problem List. (For example, if  patient is on BP meds at home and you resume or decide to hold them, it is a problem that needs to be her. Same for CAD, COPD, HLD and so on)  Unstable angina - Start ACS medication including aspirin , heparin  drip - Allergy to beta-blocker in the past - Will add hydrochlorothiazide and lisinopril for blood pressure control - Ischemic workup as per cardiology - Risk factor modification, check A1c and lipid panel - Echocardiogram  HTN - Starting hydrochlorothiazide and lisinopril  HLD - Check A1c and lipid panel  DVT prophylaxis: Heparin  drip Code Status: Full code Family Communication: Wife and sister at bedside Disposition Plan: Patient sick with unstable angina requiring assessment  treated and inpatient ischemia workup, expect more than 2 midnight hospital stay Consults called: Cardiology Admission status: PCU admit   Cort ONEIDA Mana MD [facility] Pager [phone]  09/08/2024, 12:48 PM

## 2024-09-08 NOTE — ED Notes (Signed)
 Called CCMD to place patient on central monitoring at this time.

## 2024-09-09 ENCOUNTER — Inpatient Hospital Stay: Admit: 2024-09-09 | Discharge: 2024-09-09 | Disposition: A | Attending: Internal Medicine

## 2024-09-09 ENCOUNTER — Inpatient Hospital Stay

## 2024-09-09 ENCOUNTER — Inpatient Hospital Stay: Admit: 2024-09-09

## 2024-09-09 DIAGNOSIS — R0600 Dyspnea, unspecified: Principal | ICD-10-CM

## 2024-09-09 DIAGNOSIS — I10 Essential (primary) hypertension: Secondary | ICD-10-CM | POA: Diagnosis not present

## 2024-09-09 DIAGNOSIS — R0602 Shortness of breath: Secondary | ICD-10-CM | POA: Diagnosis not present

## 2024-09-09 DIAGNOSIS — R0789 Other chest pain: Secondary | ICD-10-CM | POA: Diagnosis not present

## 2024-09-09 LAB — TECHNOLOGIST SMEAR REVIEW

## 2024-09-09 LAB — CBC
HCT: 27.1 % — ABNORMAL LOW (ref 39.0–52.0)
Hemoglobin: 9.5 g/dL — ABNORMAL LOW (ref 13.0–17.0)
MCH: 36.5 pg — ABNORMAL HIGH (ref 26.0–34.0)
MCHC: 35.1 g/dL (ref 30.0–36.0)
MCV: 104.2 fL — ABNORMAL HIGH (ref 80.0–100.0)
Platelets: 187 K/uL (ref 150–400)
RBC: 2.6 MIL/uL — ABNORMAL LOW (ref 4.22–5.81)
RDW: 18 % — ABNORMAL HIGH (ref 11.5–15.5)
WBC: 4 K/uL (ref 4.0–10.5)
nRBC: 0.8 % — ABNORMAL HIGH (ref 0.0–0.2)

## 2024-09-09 LAB — NM MYOCAR MULTI W/SPECT W/WALL MOTION / EF
Estimated workload: 1
Exercise duration (min): 1 min
Exercise duration (sec): 0 s
LV dias vol: 84 mL (ref 62–150)
LV sys vol: 21 mL (ref 4.2–5.8)
MPHR: 142 {beats}/min
Nuc Stress EF: 75 %
Peak HR: 84 {beats}/min
Percent HR: 59 %
Rest HR: 59 {beats}/min
Rest Nuclear Isotope Dose: 10.5 mCi
SDS: 1
SRS: 17
SSS: 5
Stress Nuclear Isotope Dose: 32.1 mCi
TID: 0.93

## 2024-09-09 LAB — CBC WITH DIFFERENTIAL/PLATELET
Abs Immature Granulocytes: 0.01 K/uL (ref 0.00–0.07)
Basophils Absolute: 0.1 K/uL (ref 0.0–0.1)
Basophils Relative: 1 %
Eosinophils Absolute: 0.1 K/uL (ref 0.0–0.5)
Eosinophils Relative: 1 %
HCT: 27.7 % — ABNORMAL LOW (ref 39.0–52.0)
Hemoglobin: 9.4 g/dL — ABNORMAL LOW (ref 13.0–17.0)
Immature Granulocytes: 0 %
Lymphocytes Relative: 45 %
Lymphs Abs: 1.8 K/uL (ref 0.7–4.0)
MCH: 36.3 pg — ABNORMAL HIGH (ref 26.0–34.0)
MCHC: 33.9 g/dL (ref 30.0–36.0)
MCV: 106.9 fL — ABNORMAL HIGH (ref 80.0–100.0)
Monocytes Absolute: 0.5 K/uL (ref 0.1–1.0)
Monocytes Relative: 11 %
Neutro Abs: 1.7 K/uL (ref 1.7–7.7)
Neutrophils Relative %: 42 %
Platelets: 179 K/uL (ref 150–400)
RBC: 2.59 MIL/uL — ABNORMAL LOW (ref 4.22–5.81)
RDW: 18.3 % — ABNORMAL HIGH (ref 11.5–15.5)
WBC: 4 K/uL (ref 4.0–10.5)
nRBC: 0.8 % — ABNORMAL HIGH (ref 0.0–0.2)

## 2024-09-09 LAB — BASIC METABOLIC PANEL WITH GFR
Anion gap: 8 (ref 5–15)
BUN: 24 mg/dL — ABNORMAL HIGH (ref 8–23)
CO2: 27 mmol/L (ref 22–32)
Calcium: 8.7 mg/dL — ABNORMAL LOW (ref 8.9–10.3)
Chloride: 103 mmol/L (ref 98–111)
Creatinine, Ser: 0.99 mg/dL (ref 0.61–1.24)
GFR, Estimated: >60 mL/min (ref >60)
Glucose, Bld: 100 mg/dL — ABNORMAL HIGH (ref 70–99)
Potassium: 3.6 mmol/L (ref 3.5–5.1)
Sodium: 138 mmol/L (ref 135–145)

## 2024-09-09 LAB — ECHOCARDIOGRAM COMPLETE
AR max vel: 2.93 cm2
AV Area VTI: 3.2 cm2
AV Area mean vel: 2.78 cm2
AV Mean grad: 3.5 mmHg
AV Peak grad: 5.8 mmHg
Ao pk vel: 1.2 m/s
Area-P 1/2: 3.06 cm2
Height: 70 in
MV VTI: 3.27 cm2
S' Lateral: 3.2 cm
Weight: 2640 [oz_av]

## 2024-09-09 LAB — HEPARIN LEVEL (UNFRACTIONATED): Heparin Unfractionated: 0.33 [IU]/mL (ref 0.30–0.70)

## 2024-09-09 LAB — FOLATE: Folate: 12.7 ng/mL (ref >5.9)

## 2024-09-09 MED ORDER — ISOSORBIDE MONONITRATE ER 30 MG PO TB24: 30.0000 mg | ORAL_TABLET | Freq: Every day | ORAL | 1 refills | Status: DC

## 2024-09-09 MED ORDER — ISOSORBIDE MONONITRATE ER 60 MG PO TB24: 30.0000 mg | ORAL_TABLET | Freq: Every day | ORAL | Status: DC

## 2024-09-09 MED ORDER — REGADENOSON 0.4 MG/5ML IV SOLN
0.4000 mg | Freq: Once | INTRAVENOUS | Status: AC
  Administered 2024-09-09: 0.4 mg via INTRAVENOUS

## 2024-09-09 MED ORDER — TECHNETIUM TC 99M TETROFOSMIN IV KIT
10.5400 | PACK | Freq: Once | INTRAVENOUS | Status: AC | PRN
  Administered 2024-09-09: 10.54 via INTRAVENOUS

## 2024-09-09 MED ORDER — TECHNETIUM TC 99M TETROFOSMIN IV KIT
32.0600 | PACK | Freq: Once | INTRAVENOUS | Status: AC | PRN
  Administered 2024-09-09: 32.06 via INTRAVENOUS

## 2024-09-09 NOTE — Care Management Obs Status (Signed)
 MEDICARE OBSERVATION STATUS NOTIFICATION   Patient Details  Name: [name]. MRN: [id number] Date of Birth: 1946-10-04   Medicare Observation Status Notification Given:  Yes    [name], RN 09/09/2024, 3:32 PM

## 2024-09-09 NOTE — Progress Notes (Signed)
*  PRELIMINARY RESULTS* Echocardiogram 2D Echocardiogram has been performed.  Donald Barker 09/09/2024, 11:26 AM

## 2024-09-09 NOTE — Consult Note (Signed)
 PHARMACY - ANTICOAGULATION CONSULT NOTE  Pharmacy Consult for Heparin  Indication: chest pain/ACS  Allergies  Allergen Reactions   Metoprolol Tartrate Shortness Of Breath    Patient Measurements: Height: 5' 10 (177.8 cm) Weight: 74.8 kg (165 lb) IBW/kg (Calculated) : 73 HEPARIN  DW (KG): 74.8  Vital Signs: Temp: 98 F (36.7 C) (10/07 2000) BP: 94/59 (10/08 0636) Pulse Rate: 56 (10/08 0636)  Labs: Recent Labs    09/08/24 1136 09/08/24 1318 09/08/24 1727 09/08/24 2138 09/09/24 0639  HGB 10.8*  --   --   --   --   HCT 31.4*  --   --   --   --   PLT 233  --   --   --   --   APTT  --  33  --   --   --   LABPROT  --  14.0  --   --   --   INR  --  1.0  --   --   --   HEPARINUNFRC  --   --   --  0.24* 0.33  CREATININE 1.21  --   --   --   --   TROPONINIHS 6 12 13   --   --     Estimated Creatinine Clearance: 52 mL/min (by C-G formula based on SCr of 1.21 mg/dL).   Medical History: Past Medical History:  Diagnosis Date   Chronic bronchitis (HCC)    History of adenomatous polyp of colon    Hypertension    Pre-diabetes    Pure hypercholesterolemia     Medications:  No history of chronic anticoagulation PTA  Assessment: 78 y.o. male with medical history significant of HTN, HLD, prediabetes presented with worsening chest pains. Troponin level of 6, unremarkable. Pharmacy has been consulted to dose and monitor heparin  infusion.  Baseline labs: Hgb 10.8, Plts 233, INR and aPTT pending  Goal of Therapy:  Heparin  level 0.3-0.7 units/ml Monitor platelets by anticoagulation protocol: Yes  10/7:  HL @ 2138 = 0.24, SUBtherapeutic   Plan:  10/8: HL @ 0639 = 0.33, therapeutic x 1 - Continue heparin  drip rate at 1100 units/hr - check confirmatory HL in 8 hrs  - continue to monitor H&H and platelets  Breionna Punt A Maleke Feria 09/09/2024,7:18 AM

## 2024-09-09 NOTE — Care Management CC44 (Signed)
 Condition Code 44 Documentation Completed  Patient Details  Name: Donald Barker. MRN: [id number] Date of Birth: 09-14-1946   Condition Code 44 given:  Yes Patient signature on Condition Code 44 notice:  Yes Documentation of 2 MD's agreement:  Yes Code 44 added to claim:  Yes    [name], RN 09/09/2024, 3:33 PM

## 2024-09-09 NOTE — Discharge Summary (Signed)
 Donald Barker Elio Mickey. FMW:[id number] DOB: 10-19-1946 DOA: 09/08/2024  PCP: Lenon Layman ORN, MD  Admit date: 09/08/2024 Discharge date: 09/09/2024  Time spent: 35 minutes  Recommendations for Outpatient Follow-up:  Pcp f/u, continue w/u of macrocytic anemia, f/u b12/folate/smear which have been ordered and are pending Cardiology f/u 2 weeks Consider pulmonary f/u as well (Dr. Daphane)     Discharge Diagnoses:  Principal Problem:   Dyspnea   Discharge Condition: improved  Diet recommendation: heart healthy  Filed Weights   09/08/24 1133  Weight: 74.8 kg    History of present illness:  From admission h and p Donald Barker. is a 78 y.o. male with medical history significant of HTN, HLD, prediabetes presented with worsening chest pains.   Patient has had on and off chest pains for last 4+ years for which she has been followed by cardiology.  He described the chest pain usually elicited by activity, relieved by resting.  Associated with palpitations and feeling nausea sometimes.  Denied any nocturnal pain.  He underwent stress echo last week, which showed abnormal EKG response as well as hypokinesis of anterior and inferior wall.  Today he came in with severe episode of chest pain lasted for more than an hour pressure-like, associated with palpitations.  Chest pain subsided en route to ED, no nitro was given.    Hospital Course:   Patient with a several year history of chest tightness and exertional dyspnea, s/p extensive outpatient evaluation. Had a particularly bad episode so came to our ER. Started on heparin  with concern for unstable angina (trops negative). Cardiology consulted. W/u performed with TTE (unremarkable) and nuclear stress test (low risk study). O2 normal and cxr clear. Labs show macrocytic anemia, otherwise normal. Has previously been evaluated for PE with CTA (negative) so deferred PE eval here, particularly given normal o2, clear cxr, no phtn seen on tte.  Etiology of patient's dyspnea remains elusive but symptoms are much improved and above w/u is negative, stable for discharge to continue outpatient evaluation. Cardiology has advised starting imdur and will follow up with the patient in about 2 weeks. Advise PCP f/u to continue anemia evaluation, and also advise pulmonology f/u (Dr. Daphane).   Procedures: Nuclear stress   Consultations: cardiology  Discharge Exam: Vitals:   09/09/24 1233 09/09/24 1236  BP:  (!) 100/57  Pulse:  66  Resp:  12  Temp: 97.9 F (36.6 C)   SpO2:  97%    General: NAD Cardiovascular: RRR Respiratory: CTAB  Discharge Instructions   Discharge Instructions     Diet - low sodium heart healthy   Complete by: As directed    Increase activity slowly   Complete by: As directed       Allergies as of 09/09/2024       Reactions   Metoprolol Tartrate Shortness Of Breath        Medication List     TAKE these medications    albuterol  108 (90 Base) MCG/ACT inhaler Commonly known as: VENTOLIN  HFA Inhale 2 puffs into the lungs every 4 (four) hours as needed for wheezing.   isosorbide mononitrate 30 MG 24 hr tablet Commonly known as: IMDUR Take 1 tablet (30 mg total) by mouth daily. Start taking on: September 10, 2024   predniSONE 20 MG tablet Commonly known as: DELTASONE Take 20 mg by mouth daily as needed.       Allergies  Allergen Reactions   Metoprolol Tartrate Shortness Of Breath    Follow-up Information  Paraschos, [name], MD. Go in 2 week(s).   Specialty: Cardiology Why: Appointment scheduled for 09/22/2024 at 9:45 AM Contact information: [address] [facility] [place] [address] [phone]         Lenon Layman ORN, MD Follow up.   Specialty: Internal Medicine Contact information: [address] [facility] [place] [address] [phone]         Parris Manna, MD Follow up.   Specialty:  Pulmonary Disease Contact information: [address] [place] [address] [phone]                  The results of significant diagnostics from this hospitalization (including imaging, microbiology, ancillary and laboratory) are listed below for reference.    Significant Diagnostic Studies: NM Myocar Multi W/Spect W/Wall Motion / EF Result Date: 09/09/2024   Findings are consistent with infarction. The study is low risk.   ST depression was noted.   Left ventricular function is normal. Nuclear stress EF: 75%. The left ventricular ejection fraction is normal (55-65%). End diastolic cavity size is normal. End systolic cavity size is normal. Mild fixed apical defect with reverse redistribution consistent with mild scar without ischemia Normal LV function Low risk study   ECHOCARDIOGRAM COMPLETE Result Date: 09/09/2024    ECHOCARDIOGRAM REPORT   Patient Name:   Donald Barker. Date of Exam: 09/09/2024 Medical Rec #:  [id number]          Height:       70.0 in Accession #:    [id number]         Weight:       165.0 lb Date of Birth:  1946/04/09          BSA:          1.923 m Patient Age:    78 years           BP:           119/45 mmHg Patient Gender: M                  HR:           78 bpm. Exam Location:  [facility] Procedure: 2D Echo, Cardiac Doppler and Color Doppler (Both Spectral and Color            Flow Doppler were utilized during procedure). Indications:     Chest pain R07.9  History:         Patient has no prior history of Echocardiogram examinations.                  Risk Factors:Hypertension.  Sonographer:     Christopher Furnace Referring Phys:  [id number] CORT ONEIDA MANA Diagnosing Phys: Marsa Dooms MD IMPRESSIONS  1. Left ventricular ejection fraction, by estimation, is 55 to 60%. The left ventricle has normal function. The left ventricle has no regional wall motion abnormalities. Left ventricular diastolic parameters are consistent with Grade I diastolic dysfunction (impaired  relaxation).  2. Right ventricular systolic function is normal. The right ventricular size is normal.  3. The mitral valve is normal in structure. Mild mitral valve regurgitation. No evidence of mitral stenosis.  4. The aortic valve is normal in structure. Aortic valve regurgitation is not visualized. No aortic stenosis is present.  5. The inferior vena cava is normal in size with greater than 50% respiratory variability, suggesting right atrial pressure of 3 mmHg. FINDINGS  Left Ventricle: Left ventricular ejection fraction,  by estimation, is 55 to 60%. The left ventricle has normal function. The left ventricle has no regional wall motion abnormalities. Strain was performed and the global longitudinal strain is indeterminate. The left ventricular internal cavity size was normal in size. There is no left ventricular hypertrophy. Left ventricular diastolic parameters are consistent with Grade I diastolic dysfunction (impaired relaxation). Right Ventricle: The right ventricular size is normal. No increase in right ventricular wall thickness. Right ventricular systolic function is normal. Left Atrium: Left atrial size was normal in size. Right Atrium: Right atrial size was normal in size. Pericardium: There is no evidence of pericardial effusion. Mitral Valve: The mitral valve is normal in structure. Mild mitral valve regurgitation. No evidence of mitral valve stenosis. MV peak gradient, 3.6 mmHg. The mean mitral valve gradient is 1.0 mmHg. Tricuspid Valve: The tricuspid valve is normal in structure. Tricuspid valve regurgitation is mild . No evidence of tricuspid stenosis. Aortic Valve: The aortic valve is normal in structure. Aortic valve regurgitation is not visualized. No aortic stenosis is present. Aortic valve mean gradient measures 3.5 mmHg. Aortic valve peak gradient measures 5.8 mmHg. Aortic valve area, by VTI measures 3.20 cm. Pulmonic Valve: The pulmonic valve was normal in structure. Pulmonic valve  regurgitation is not visualized. No evidence of pulmonic stenosis. Aorta: The aortic root is normal in size and structure. Venous: The inferior vena cava is normal in size with greater than 50% respiratory variability, suggesting right atrial pressure of 3 mmHg. IAS/Shunts: No atrial level shunt detected by color flow Doppler. Additional Comments: 3D was performed not requiring image post processing on an independent workstation and was indeterminate.  LEFT VENTRICLE PLAX 2D LVIDd:         4.60 cm   Diastology LVIDs:         3.20 cm   LV e' medial:    5.22 cm/s LV PW:         1.00 cm   LV E/e' medial:  10.7 LV IVS:        1.30 cm   LV e' lateral:   6.85 cm/s LVOT diam:     2.00 cm   LV E/e' lateral: 8.2 LV SV:         73 LV SV Index:   38 LVOT Area:     3.14 cm  RIGHT VENTRICLE RV Basal diam:  3.40 cm RV Mid diam:    2.60 cm LEFT ATRIUM           Index        RIGHT ATRIUM           Index LA diam:      2.40 cm 1.25 cm/m   RA Area:     10.10 cm LA Vol (A2C): 42.7 ml 22.20 ml/m  RA Volume:   21.70 ml  11.28 ml/m LA Vol (A4C): 9.5 ml  4.93 ml/m  AORTIC VALVE AV Area (Vmax):    2.93 cm AV Area (Vmean):   2.78 cm AV Area (VTI):     3.20 cm AV Vmax:           120.00 cm/s AV Vmean:          85.850 cm/s AV VTI:            0.227 m AV Peak Grad:      5.8 mmHg AV Mean Grad:      3.5 mmHg LVOT Vmax:         112.00 cm/s LVOT Vmean:  75.900 cm/s LVOT VTI:          0.231 m LVOT/AV VTI ratio: 1.02  AORTA Ao Root diam: 3.10 cm MITRAL VALVE MV Area (PHT): 3.06 cm    SHUNTS MV Area VTI:   3.27 cm    Systemic VTI:  0.23 m MV Peak grad:  3.6 mmHg    Systemic Diam: 2.00 cm MV Mean grad:  1.0 mmHg MV Vmax:       0.95 m/s MV Vmean:      40.0 cm/s MV Decel Time: 248 msec MV E velocity: 56.10 cm/s MV A velocity: 87.00 cm/s MV E/A ratio:  0.64 Marsa Dooms MD Electronically signed by Marsa Dooms MD Signature Date/Time: 09/09/2024/12:55:24 PM    Final    DG Chest Portable 1 View Result Date: 09/08/2024 EXAM: 1  VIEW(S) XRAY OF THE CHEST 09/08/2024 12:00:00 PM COMPARISON: None available. CLINICAL HISTORY: Chest pain and SOB. FINDINGS: LUNGS AND PLEURA: No focal pulmonary opacity. No pulmonary edema. No pleural effusion. No pneumothorax. HEART AND MEDIASTINUM: No acute abnormality of the cardiac and mediastinal silhouettes. BONES AND SOFT TISSUES: No acute osseous abnormality. IMPRESSION: 1. No acute cardiopulmonary pathology. Electronically signed by: Ryan Salvage MD 09/08/2024 12:52 PM EDT RP Workstation: [id number]    Microbiology: No results found for this or any previous visit (from the past 240 hours).   Labs: Basic Metabolic Panel: Recent Labs  Lab 09/08/24 1136 09/09/24 0639  NA 136 138  K 3.6 3.6  CL 102 103  CO2 22 27  GLUCOSE 161* 100*  BUN 25* 24*  CREATININE 1.21 0.99  CALCIUM 9.3 8.7*   Liver Function Tests: No results for input(s): AST, ALT, ALKPHOS, BILITOT, PROT, ALBUMIN in the last 168 hours. No results for input(s): LIPASE, AMYLASE in the last 168 hours. No results for input(s): AMMONIA in the last 168 hours. CBC: Recent Labs  Lab 09/08/24 1136 09/09/24 0639  WBC 4.8 4.0  HGB 10.8* 9.5*  HCT 31.4* 27.1*  MCV 103.6* 104.2*  PLT 233 187   Cardiac Enzymes: No results for input(s): CKTOTAL, CKMB, CKMBINDEX, TROPONINI in the last 168 hours. BNP: BNP (last 3 results) No results for input(s): BNP in the last 8760 hours.  ProBNP (last 3 results) No results for input(s): PROBNP in the last 8760 hours.  CBG: No results for input(s): GLUCAP in the last 168 hours.     Signed:  Devaughn KATHEE Ban MD.  [facility] 09/09/2024, 2:23 PM

## 2024-09-09 NOTE — Progress Notes (Addendum)
 [facility] CLINIC CARDIOLOGY PROGRESS NOTE       Patient ID: Donald Barker. MRN: [id number] DOB/AGE: September 22, 1946 78 y.o.  Admit date: 09/08/2024 Referring Physician Dr. Oneil Budge Primary Physician Lenon Layman ORN, MD  Primary Cardiologist Dr. Hester (last seen 2023) Reason for Consultation chest pain, abnormal EKG  HPI: Donald Dershem. is a 78 y.o. male  with a past medical history of hypertension, hyperlipidemia, pre-diabetes, hx colon cancer, asthma who presented to the ED on 09/08/2024 for chest pain, SOB, palpitations. Cardiology was consulted for further evaluation.   Interval history: -Patient seen and examined this AM, resting in hospital bed preparing for stress test.  -Reports some atypical CP this AM which is stable to what he has been having for the last few months.  -BP and HR stable.   Review of systems complete and found to be negative unless listed above    Past Medical History:  Diagnosis Date   Chronic bronchitis (HCC)    History of adenomatous polyp of colon    Hypertension    Pre-diabetes    Pure hypercholesterolemia     Past Surgical History:  Procedure Laterality Date   BIOPSY  12/31/2023   Procedure: BIOPSY;  Surgeon: Aundria, Ladell POUR, MD;  Location: [facility] ENDOSCOPY;  Service: Gastroenterology;;   CARDIAC CATHETERIZATION     COLONOSCOPY WITH PROPOFOL  N/A 08/11/2018   Procedure: COLONOSCOPY WITH PROPOFOL ;  Surgeon: Viktoria Lamar DASEN, MD;  Location: [facility] ENDOSCOPY;  Service: Endoscopy;  Laterality: N/A;   COLONOSCOPY WITH PROPOFOL  N/A 12/31/2023   Procedure: COLONOSCOPY WITH PROPOFOL ;  Surgeon: Toledo, Ladell POUR, MD;  Location: [facility] ENDOSCOPY;  Service: Gastroenterology;  Laterality: N/A;   KNEE ARTHROSCOPY Right 12/30/2020   Procedure: Right kne arthroscopic partial synovectomy;  Surgeon: Tobie Priest, MD;  Location: [facility];  Service: Orthopedics;  Laterality: Right;   KNEE ARTHROSCOPY WITH MEDIAL MENISECTOMY Right 07/15/2020    Procedure: Right knee arthroscopic partial medial meniscectomy;  Surgeon: Tobie Priest, MD;  Location: [facility] ORS;  Service: Orthopedics;  Laterality: Right;   LEFT HEART CATH AND CORONARY ANGIOGRAPHY N/A 09/12/2022   Procedure: LEFT HEART CATH AND CORONARY ANGIOGRAPHY;  Surgeon: Hester Wolm PARAS, MD;  Location: [facility] INVASIVE CV LAB;  Service: Cardiovascular;  Laterality: N/A;   POLYPECTOMY  12/31/2023   Procedure: POLYPECTOMY;  Surgeon: Toledo, Ladell POUR, MD;  Location: [facility] ENDOSCOPY;  Service: Gastroenterology;;    (Not in a hospital admission)  Social History   Socioeconomic History   Marital status: Married    Spouse name: Not on file   Number of children: Not on file   Years of education: Not on file   Highest education level: Not on file  Occupational History   Not on file  Tobacco Use   Smoking status: Former    Current packs/day: 0.00    Types: Cigarettes    Quit date: 08/11/1984    Years since quitting: 40.1   Smokeless tobacco: Never  Vaping Use   Vaping status: Never Used  Substance and Sexual Activity   Alcohol use: Yes    Alcohol/week: 2.0 standard drinks of alcohol    Types: 1 Cans of beer, 1 Glasses of wine per week   Drug use: Never   Sexual activity: Not on file  Other Topics Concern   Not on file  Social History Narrative   Lives at home with wife   Social Drivers of Health   Financial Resource Strain: Low Risk  (07/04/2024)   Received from  [facility]   Overall Financial Resource Strain (CARDIA)    Difficulty of Paying Living Expenses: Not hard at all  Food Insecurity: No Food Insecurity (07/04/2024)   Received from [facility]   Hunger Vital Sign    Within the past 12 months, you worried that your food would run out before you got the money to buy more.: Never true    Within the past 12 months, the food you bought just didn't last and you didn't have money to get more.: Never true  Transportation Needs: No  Transportation Needs (07/04/2024)   Received from [facility] - Transportation    In the past 12 months, has lack of transportation kept you from medical appointments or from getting medications?: No    Lack of Transportation (Non-Medical): No  Physical Activity: Not on file  Stress: Not on file  Social Connections: Not on file  Intimate Partner Violence: Not on file    History reviewed. No pertinent family history.   Vitals:   09/09/24 0636 09/09/24 0725 09/09/24 0727 09/09/24 0800  BP: (!) 94/59 (!) 94/52 (!) 92/52   Pulse: (!) 56 60 (!) 53   Resp: 15 15 10    Temp:    97.9 F (36.6 C)  TempSrc:    Oral  SpO2: 100% 96% 98%   Weight:      Height:        PHYSICAL EXAM General: Well-appearing elderly male, well nourished, in no acute distress. HEENT: Normocephalic and atraumatic. Neck: No JVD.  Lungs: Normal respiratory effort on room air. Clear bilaterally to auscultation. No wheezes, crackles, rhonchi.  Heart: HRRR. Normal S1 and S2 without gallops or murmurs.  Abdomen: Non-distended appearing.  Msk: Normal strength and tone for age. Extremities: Warm and well perfused. No clubbing, cyanosis.  No edema.  Neuro: Alert and oriented X 3. Psych: Answers questions appropriately.   Labs: Basic Metabolic Panel: Recent Labs    09/08/24 1136 09/09/24 0639  NA 136 138  K 3.6 3.6  CL 102 103  CO2 22 27  GLUCOSE 161* 100*  BUN 25* 24*  CREATININE 1.21 0.99  CALCIUM 9.3 8.7*   Liver Function Tests: No results for input(s): AST, ALT, ALKPHOS, BILITOT, PROT, ALBUMIN in the last 72 hours. No results for input(s): LIPASE, AMYLASE in the last 72 hours. CBC: Recent Labs    09/08/24 1136 09/09/24 0639  WBC 4.8 4.0  HGB 10.8* 9.5*  HCT 31.4* 27.1*  MCV 103.6* 104.2*  PLT 233 187   Cardiac Enzymes: Recent Labs    09/08/24 1136 09/08/24 1318 09/08/24 1727  TROPONINIHS 6 12 13    BNP: No results for input(s): BNP in the  last 72 hours. D-Dimer: No results for input(s): DDIMER in the last 72 hours. Hemoglobin A1C: No results for input(s): HGBA1C in the last 72 hours. Fasting Lipid Panel: No results for input(s): CHOL, HDL, LDLCALC, TRIG, CHOLHDL, LDLDIRECT in the last 72 hours. Thyroid  Function Tests: No results for input(s): TSH, T4TOTAL, T3FREE, THYROIDAB in the last 72 hours.  Invalid input(s): FREET3 Anemia Panel: No results for input(s): VITAMINB12, FOLATE, FERRITIN, TIBC, IRON, RETICCTPCT in the last 72 hours.   Radiology: DG Chest Portable 1 View Result Date: 09/08/2024 EXAM: 1 VIEW(S) XRAY OF THE CHEST 09/08/2024 12:00:00 PM COMPARISON: None available. CLINICAL HISTORY: Chest pain and SOB. FINDINGS: LUNGS AND PLEURA: No focal pulmonary opacity. No pulmonary edema. No pleural effusion. No pneumothorax. HEART AND MEDIASTINUM: No  acute abnormality of the cardiac and mediastinal silhouettes. BONES AND SOFT TISSUES: No acute osseous abnormality. IMPRESSION: 1. No acute cardiopulmonary pathology. Electronically signed by: Ryan Salvage MD 09/08/2024 12:52 PM EDT RP Workstation: [id number]    ECHO pending  TELEMETRY reviewed by me 09/09/2024: Sinus rhythm rate 50s  EKG reviewed by me: normal sinus rhythm with PVC, ST depression in inferior leads as well as minimal ST elevation in aVR  Data reviewed by me 09/09/2024: last 24h vitals tele labs imaging I/O ED provider note, admission H&P, hospitalist progress note  Principal Problem:   Angina at rest Active Problems:   Unstable angina (HCC)    ASSESSMENT AND PLAN:  Donald Favata. is a 78 y.o. male  with a past medical history of hypertension, hyperlipidemia, pre-diabetes, hx colon cancer, asthma who presented to the ED on 09/08/2024 for chest pain, SOB, palpitations. Cardiology was consulted for further evaluation.   # Chest pain # Shortness of breath # Hypertension Patient presented with episode of chest  tightness and shortness of breath which lasted for roughly 2 hours this morning and is now resolved.  Troponins normal x 2 at 6, 12.  EKG with normal ST elevation in aVR and ST depression noted in inferior leads.  Recently had a stress echo at PCP office which revealed inferior hypokinesis at peak stress, EF preserved on rest images. -Echo pending.  Nuclear stress test this AM, restings pending.  -S/p ASA 325 mg in the ED. Continue aspirin  81 mg daily. -Heparin  was ordered by primary team. -Lisinopril 20 mg daily for BP control.  ADDENDUM: Echo with preserved EF, no WMAs. Nuclear stress with mild apical scar, no evidence of ischemia. No indication for further invasive cardiac evaluation at this time. DC heparin . Stop lisinopril/hydrochlorothiazide and start imdur 30 mg daily. Plan for follow up in clinic in 2 weeks.   This patient's plan of care was discussed and created with Dr. Ammon and he is in agreement.  Signed: Danita Bloch, PA-C  09/09/2024, 10:12 AM [facility] Cardiology

## 2024-09-10 LAB — LIPOPROTEIN A (LPA): Lipoprotein (a): 56 nmol/L — ABNORMAL HIGH (ref <75.0)

## 2024-09-11 DIAGNOSIS — R0789 Other chest pain: Secondary | ICD-10-CM | POA: Diagnosis not present

## 2024-09-11 DIAGNOSIS — E7800 Pure hypercholesterolemia, unspecified: Secondary | ICD-10-CM | POA: Diagnosis not present

## 2024-09-11 DIAGNOSIS — R002 Palpitations: Secondary | ICD-10-CM | POA: Diagnosis not present

## 2024-09-14 ENCOUNTER — Other Ambulatory Visit
Admission: RE | Admit: 2024-09-14 | Discharge: 2024-09-14 | Disposition: A | Discharge status: Home / Self Care | Source: Ambulatory Visit | Attending: Internal Medicine | Admitting: Internal Medicine

## 2024-09-14 DIAGNOSIS — D649 Anemia, unspecified: Secondary | ICD-10-CM | POA: Diagnosis not present

## 2024-09-14 DIAGNOSIS — R0600 Dyspnea, unspecified: Secondary | ICD-10-CM | POA: Diagnosis not present

## 2024-09-14 LAB — D-DIMER, QUANTITATIVE: D-Dimer, Quant: <0.27 ug{FEU}/mL (ref 0.00–0.50)

## 2024-09-23 ENCOUNTER — Inpatient Hospital Stay

## 2024-09-23 ENCOUNTER — Inpatient Hospital Stay: Attending: Oncology | Admitting: Oncology

## 2024-09-23 ENCOUNTER — Encounter: Payer: Self-pay | Admitting: Oncology

## 2024-09-23 VITALS — BP 108/89 | HR 58 | Temp 97.1°F | Resp 18 | Wt 167.0 lb

## 2024-09-23 DIAGNOSIS — Z79899 Other long term (current) drug therapy: Secondary | ICD-10-CM | POA: Insufficient documentation

## 2024-09-23 DIAGNOSIS — E7800 Pure hypercholesterolemia, unspecified: Secondary | ICD-10-CM | POA: Insufficient documentation

## 2024-09-23 DIAGNOSIS — J42 Unspecified chronic bronchitis: Secondary | ICD-10-CM | POA: Diagnosis not present

## 2024-09-23 DIAGNOSIS — Z800 Family history of malignant neoplasm of digestive organs: Secondary | ICD-10-CM | POA: Diagnosis not present

## 2024-09-23 DIAGNOSIS — R0789 Other chest pain: Secondary | ICD-10-CM | POA: Diagnosis not present

## 2024-09-23 DIAGNOSIS — Z87891 Personal history of nicotine dependence: Secondary | ICD-10-CM | POA: Diagnosis not present

## 2024-09-23 DIAGNOSIS — C9000 Multiple myeloma not having achieved remission: Secondary | ICD-10-CM | POA: Diagnosis not present

## 2024-09-23 DIAGNOSIS — I371 Nonrheumatic pulmonary valve insufficiency: Secondary | ICD-10-CM | POA: Insufficient documentation

## 2024-09-23 DIAGNOSIS — Z860101 Personal history of adenomatous and serrated colon polyps: Secondary | ICD-10-CM | POA: Insufficient documentation

## 2024-09-23 DIAGNOSIS — D7589 Other specified diseases of blood and blood-forming organs: Secondary | ICD-10-CM | POA: Insufficient documentation

## 2024-09-23 DIAGNOSIS — D539 Nutritional anemia, unspecified: Secondary | ICD-10-CM | POA: Diagnosis not present

## 2024-09-23 DIAGNOSIS — I10 Essential (primary) hypertension: Secondary | ICD-10-CM | POA: Diagnosis not present

## 2024-09-23 LAB — COMPREHENSIVE METABOLIC PANEL WITH GFR
ALT: 16 U/L (ref 0–44)
AST: 19 U/L (ref 15–41)
Albumin: 4.9 g/dL (ref 3.5–5.0)
Alkaline Phosphatase: 60 U/L (ref 38–126)
Anion gap: 9 (ref 5–15)
BUN: 21 mg/dL (ref 8–23)
CO2: 26 mmol/L (ref 22–32)
Calcium: 9.3 mg/dL (ref 8.9–10.3)
Chloride: 101 mmol/L (ref 98–111)
Creatinine, Ser: 0.98 mg/dL (ref 0.61–1.24)
GFR, Estimated: >60 mL/min (ref >60)
Glucose, Bld: 94 mg/dL (ref 70–99)
Potassium: 4 mmol/L (ref 3.5–5.1)
Sodium: 136 mmol/L (ref 135–145)
Total Bilirubin: 1.4 mg/dL — ABNORMAL HIGH (ref 0.0–1.2)
Total Protein: 7.3 g/dL (ref 6.5–8.1)

## 2024-09-23 LAB — RETIC PANEL
Immature Retic Fract: 9.1 % (ref 2.3–15.9)
RBC.: 2.94 MIL/uL — ABNORMAL LOW (ref 4.22–5.81)
Retic Count, Absolute: 48.5 K/uL (ref 19.0–186.0)
Retic Ct Pct: 1.7 % (ref 0.4–3.1)
Reticulocyte Hemoglobin: 34.9 pg (ref >27.9)

## 2024-09-23 LAB — TECHNOLOGIST SMEAR REVIEW
Plt Morphology: NORMAL
RBC MORPHOLOGY: NORMAL
WBC MORPHOLOGY: NORMAL

## 2024-09-23 LAB — CBC WITH DIFFERENTIAL/PLATELET
Abs Immature Granulocytes: 0.01 K/uL (ref 0.00–0.07)
Basophils Absolute: 0.1 K/uL (ref 0.0–0.1)
Basophils Relative: 1 %
Eosinophils Absolute: 0 K/uL (ref 0.0–0.5)
Eosinophils Relative: 1 %
HCT: 30.1 % — ABNORMAL LOW (ref 39.0–52.0)
Hemoglobin: 10.6 g/dL — ABNORMAL LOW (ref 13.0–17.0)
Immature Granulocytes: 0 %
Lymphocytes Relative: 33 %
Lymphs Abs: 1.4 K/uL (ref 0.7–4.0)
MCH: 36.3 pg — ABNORMAL HIGH (ref 26.0–34.0)
MCHC: 35.2 g/dL (ref 30.0–36.0)
MCV: 103.1 fL — ABNORMAL HIGH (ref 80.0–100.0)
Monocytes Absolute: 0.4 K/uL (ref 0.1–1.0)
Monocytes Relative: 10 %
Neutro Abs: 2.3 K/uL (ref 1.7–7.7)
Neutrophils Relative %: 55 %
Platelets: 238 K/uL (ref 150–400)
RBC: 2.92 MIL/uL — ABNORMAL LOW (ref 4.22–5.81)
RDW: 17.9 % — ABNORMAL HIGH (ref 11.5–15.5)
WBC: 4.3 K/uL (ref 4.0–10.5)
nRBC: 0.5 % — ABNORMAL HIGH (ref 0.0–0.2)

## 2024-09-23 LAB — IRON AND TIBC
Iron: 255 ug/dL — ABNORMAL HIGH (ref 45–182)
Saturation Ratios: 75 % — ABNORMAL HIGH (ref 17.9–39.5)
TIBC: 339 ug/dL (ref 250–450)
UIBC: 84 ug/dL

## 2024-09-23 LAB — FERRITIN: Ferritin: 674 ng/mL — ABNORMAL HIGH (ref 24–336)

## 2024-09-23 LAB — LACTATE DEHYDROGENASE: LDH: 114 U/L (ref 98–192)

## 2024-09-23 NOTE — Assessment & Plan Note (Signed)
 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 Vitamin B12 and folate levels normal.  Check CBC, CMP, iron TIBC ferritin, reticulocyte panel, multiple myeloma, maturational, LDH copper haptoglobin

## 2024-09-23 NOTE — Progress Notes (Addendum)
 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 Vitamin B12 and folate levels normal.  Check CBC, CMP, iron TIBC ferritin, reticulocyte panel, multiple myeloma, maturational, LDH copper  haptoglobin    Orders Placed This Encounter  Procedures   Comprehensive metabolic panel with GFR    Standing Status:   Future    Number of Occurrences:   1    Expected Date:   09/23/2024    Expiration Date:   12/22/2024   CBC with Differential/Platelet    Standing Status:   Future    Number of Occurrences:   1    Expected Date:   09/23/2024    Expiration Date:   12/22/2024   Iron and TIBC    Standing Status:   Future    Number of Occurrences:   1    Expected Date:   09/23/2024    Expiration Date:   12/22/2024   Ferritin    Standing Status:   Future    Number of Occurrences:   1    Expected Date:   09/23/2024    Expiration Date:   12/22/2024   Retic Panel    Standing Status:   Future    Number of Occurrences:   1    Expected Date:   09/23/2024    Expiration Date:   12/22/2024   Multiple Myeloma Panel (SPEP&IFE w/QIG)    Standing Status:   Future    Number of Occurrences:   1    Expected Date:   09/23/2024    Expiration Date:   12/22/2024   Kappa/lambda light chains    Standing Status:   Future    Number of Occurrences:   1    Expected Date:   09/23/2024    Expiration Date:   12/22/2024   Lactate dehydrogenase    Standing Status:   Future    Number of Occurrences:   1    Expected Date:   09/23/2024    Expiration Date:   12/22/2024   Copper , serum    Standing Status:   Future    Number of Occurrences:   1    Expected Date:   09/23/2024    Expiration Date:   12/22/2024   Haptoglobin    Standing  Status:   Future    Number of Occurrences:   1    Expected Date:   09/23/2024    Expiration Date:   12/22/2024   Technologist smear review    Standing Status:   Future    Number of Occurrences:   1    Expected Date:   09/23/2024    Expiration Date:   12/22/2024    Clinical information::   anemia    All questions were answered. The patient knows to call the clinic with any problems, questions or concerns.  Zelphia Cap, MD, PhD [facility] Hematology Oncology 09/23/2024   HISTORY OF PRESENTING ILLNESS:   Donald Barker. is a  78 y.o.  male with PMH listed below was seen in consultation at the request of  Lenon Layman ORN, MD  for evaluation of macrocytic anemia.   Discussed the use of AI scribe software for clinical note transcription with the patient, who gave verbal consent to proceed.  He has experienced a decrease in hemoglobin levels, with the most recent test on October 8th showing a hemoglobin level of 9.4 g/dL. His previous hemoglobin count in 2023 was 12.9 g/dL. He has not had regular CBC testing in recent years  He experiences shortness of breath and was recently hospitalized due to this issue. He underwent a stress test and has seen a cardiologist, who did not find any significant issues. No major chest pain, but he mentions occasional mild chest discomfort.  He reports low energy levels, stating he 'pretty much wakes up tired'. He has a history of mouth sores for which he takes prednisone as needed, typically two or three doses to resolve the ulcers.  His father died of colon cancer. He has undergone colonoscopy screening,  Socially, he drinks alcohol occasionally and is a former smoker. He does not currently smoke.   MEDICAL HISTORY:  Past Medical History:  Diagnosis Date   Chronic bronchitis (HCC)    History of adenomatous polyp of colon    Hypertension    Pre-diabetes    Pure hypercholesterolemia     SURGICAL HISTORY: Past Surgical History:  Procedure  Laterality Date   BIOPSY  12/31/2023   Procedure: BIOPSY;  Surgeon: Aundria, Ladell POUR, MD;  Location: [facility] ENDOSCOPY;  Service: Gastroenterology;;   CARDIAC CATHETERIZATION     COLONOSCOPY WITH PROPOFOL  N/A 08/11/2018   Procedure: COLONOSCOPY WITH PROPOFOL ;  Surgeon: Viktoria Lamar DASEN, MD;  Location: [facility] ENDOSCOPY;  Service: Endoscopy;  Laterality: N/A;   COLONOSCOPY WITH PROPOFOL  N/A 12/31/2023   Procedure: COLONOSCOPY WITH PROPOFOL ;  Surgeon: Toledo, Ladell POUR, MD;  Location: [facility] ENDOSCOPY;  Service: Gastroenterology;  Laterality: N/A;   KNEE ARTHROSCOPY Right 12/30/2020   Procedure: Right kne arthroscopic partial synovectomy;  Surgeon: Tobie Priest, MD;  Location: [facility];  Service: Orthopedics;  Laterality: Right;   KNEE ARTHROSCOPY WITH MEDIAL MENISECTOMY Right 07/15/2020   Procedure: Right knee arthroscopic partial medial meniscectomy;  Surgeon: Tobie Priest, MD;  Location: [facility] ORS;  Service: Orthopedics;  Laterality: Right;   LEFT HEART CATH AND CORONARY ANGIOGRAPHY N/A 09/12/2022   Procedure: LEFT HEART CATH AND CORONARY ANGIOGRAPHY;  Surgeon: Hester Wolm PARAS, MD;  Location: [facility] INVASIVE CV LAB;  Service: Cardiovascular;  Laterality: N/A;   POLYPECTOMY  12/31/2023   Procedure: POLYPECTOMY;  Surgeon: Toledo, Ladell POUR, MD;  Location: [facility] ENDOSCOPY;  Service: Gastroenterology;;    SOCIAL HISTORY: Social History   Socioeconomic History   Marital status: Married    Spouse name: Not on file   Number of children: Not on file   Years of education: Not on file   Highest education level: Not on file  Occupational History   Not on file  Tobacco Use   Smoking status: Former    Current packs/day: 0.00    Types: Cigarettes    Quit date: 08/11/1984    Years since quitting: 40.1   Smokeless tobacco: Never  Vaping Use   Vaping status: Never Used  Substance and Sexual Activity   Alcohol use: Yes    Alcohol/week: 2.0 standard drinks of alcohol    Types: 1 Cans of beer, 1  Glasses of wine per week   Drug use: Never   Sexual activity: Not on file  Other Topics Concern   Not on file  Social History Narrative   Lives at home with wife   Social Drivers of Health   Financial Resource Strain: Low Risk  (09/23/2024)   Overall Financial Resource Strain (CARDIA)  Difficulty of Paying Living Expenses: Not very hard  Food Insecurity: No Food Insecurity (09/23/2024)   Hunger Vital Sign    Worried About Running Out of Food in the Last Year: Never true    Ran Out of Food in the Last Year: Never true  Transportation Needs: No Transportation Needs (09/23/2024)   PRAPARE - Administrator, Civil Service (Medical): No    Lack of Transportation (Non-Medical): No  Physical Activity: Not on file  Stress: No Stress Concern Present (09/23/2024)   [employer] of Occupational Health - Occupational Stress Questionnaire    Feeling of Stress: Not at all  Social Connections: Not on file  Intimate Partner Violence: Not At Risk (09/23/2024)   Humiliation, Afraid, Rape, and Kick questionnaire    Fear of Current or Ex-Partner: No    Emotionally Abused: No    Physically Abused: No    Sexually Abused: No    FAMILY HISTORY: Family History  Problem Relation Age of Onset   Cancer Father     ALLERGIES:  is allergic to metoprolol tartrate.  MEDICATIONS:  Current Outpatient Medications  Medication Sig Dispense Refill   albuterol  (VENTOLIN  HFA) 108 (90 Base) MCG/ACT inhaler Inhale 2 puffs into the lungs every 4 (four) hours as needed for wheezing.     predniSONE (DELTASONE) 20 MG tablet Take 20 mg by mouth daily as needed.     No current facility-administered medications for this visit.    Review of Systems  Constitutional:  Positive for fatigue. Negative for appetite change, chills, fever and unexpected weight change.  HENT:   Negative for hearing loss and voice change.   Eyes:  Negative for eye problems and icterus.  Respiratory:  Positive for  shortness of breath. Negative for chest tightness and cough.   Cardiovascular:  Negative for chest pain and leg swelling.  Gastrointestinal:  Negative for abdominal distention and abdominal pain.  Endocrine: Negative for hot flashes.  Genitourinary:  Negative for difficulty urinating, dysuria and frequency.   Musculoskeletal:  Negative for arthralgias.  Skin:  Negative for itching and rash.  Neurological:  Negative for light-headedness and numbness.  Hematological:  Negative for adenopathy. Does not bruise/bleed easily.  Psychiatric/Behavioral:  Negative for confusion.    PHYSICAL EXAMINATION:  Vitals:   09/23/24 1125  BP: 108/89  Pulse: (!) 58  Resp: 18  Temp: (!) 97.1 F (36.2 C)  SpO2: 100%   Filed Weights   09/23/24 1125  Weight: 167 lb (75.8 kg)    Physical Exam Constitutional:      General: He is not in acute distress. HENT:     Head: Normocephalic and atraumatic.  Eyes:     General: No scleral icterus. Cardiovascular:     Rate and Rhythm: Normal rate and regular rhythm.  Pulmonary:     Effort: Pulmonary effort is normal. No respiratory distress.     Breath sounds: Normal breath sounds. No wheezing.  Abdominal:     General: Bowel sounds are normal. There is no distension.     Palpations: Abdomen is soft.  Musculoskeletal:        General: No deformity. Normal range of motion.     Cervical back: Normal range of motion and neck supple.  Skin:    General: Skin is warm and dry.     Findings: No erythema or rash.  Neurological:     Mental Status: He is alert and oriented to person, place, and time. Mental status is at baseline.  Psychiatric:  Mood and Affect: Mood normal.     LABORATORY DATA:  I have reviewed the data as listed    Latest Ref Rng & Units 09/23/2024   12:02 PM 09/09/2024    6:39 AM 09/08/2024   11:36 AM  CBC  WBC 4.0 - 10.5 K/uL 4.3  4.0    4.0  4.8   Hemoglobin 13.0 - 17.0 g/dL 89.3  9.5    9.4  89.1   Hematocrit 39.0 - 52.0 %  30.1  27.1    27.7  31.4   Platelets 150 - 400 K/uL 238  187    179  233       Latest Ref Rng & Units 09/23/2024   12:02 PM 09/09/2024    6:39 AM 09/08/2024   11:36 AM  CMP  Glucose 70 - 99 mg/dL 94  899  838   BUN 8 - 23 mg/dL 21  24  25    Creatinine 0.61 - 1.24 mg/dL 9.01  9.00  8.78   Sodium 135 - 145 mmol/L 136  138  136   Potassium 3.5 - 5.1 mmol/L 4.0  3.6  3.6   Chloride 98 - 111 mmol/L 101  103  102   CO2 22 - 32 mmol/L 26  27  22    Calcium 8.9 - 10.3 mg/dL 9.3  8.7  9.3   Total Protein 6.5 - 8.1 g/dL 7.3     Total Bilirubin 0.0 - 1.2 mg/dL 1.4     Alkaline Phos 38 - 126 U/L 60     AST 15 - 41 U/L 19     ALT 0 - 44 U/L 16         RADIOGRAPHIC STUDIES: I have personally reviewed the radiological images as listed and agreed with the findings in the report. 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 The study is low risk.   ST depression was noted.   Left ventricular function is normal. Nuclear stress EF: 75%. The left ventricular ejection fraction is normal (55-65%). End diastolic cavity size is normal. End systolic cavity size is normal. Mild fixed apical defect with reverse redistribution consistent with mild scar without ischemia Normal LV function Low risk study   ECHOCARDIOGRAM COMPLETE Result Date: 09/09/2024    ECHOCARDIOGRAM REPORT   Patient Name:   Donald Barker. Date of Exam: 09/09/2024 Medical Rec #:  [id number]          Height:       70.0 in Accession #:    [id number]         Weight:       165.0 lb Date of Birth:  08/04/1946          BSA:          1.923 m Patient Age:    78 years           BP:           119/45 mmHg Patient Gender: M                  HR:           78 bpm. Exam Location:  [facility] Procedure: 2D Echo, Cardiac Doppler and Color Doppler (Both Spectral and Color            Flow Doppler were utilized during procedure). Indications:     Chest pain R07.9  History:  Patient has no prior  history of Echocardiogram examinations.                  Risk Factors:Hypertension.  Sonographer:     Christopher Furnace Referring Phys:  [id number] CORT ONEIDA MANA Diagnosing Phys: Marsa Dooms MD IMPRESSIONS  1. Left ventricular ejection fraction, by estimation, is 55 to 60%. The left ventricle has normal function. The left ventricle has no regional wall motion abnormalities. Left ventricular diastolic parameters are consistent with Grade I diastolic dysfunction (impaired relaxation).  2. Right ventricular systolic function is normal. The right ventricular size is normal.  3. The mitral valve is normal in structure. Mild mitral valve regurgitation. No evidence of mitral stenosis.  4. The aortic valve is normal in structure. Aortic valve regurgitation is not visualized. No aortic stenosis is present.  5. The inferior vena cava is normal in size with greater than 50% respiratory variability, suggesting right atrial pressure of 3 mmHg. FINDINGS  Left Ventricle: Left ventricular ejection fraction, by estimation, is 55 to 60%. The left ventricle has normal function. The left ventricle has no regional wall motion abnormalities. Strain was performed and the global longitudinal strain is indeterminate. The left ventricular internal cavity size was normal in size. There is no left ventricular hypertrophy. Left ventricular diastolic parameters are consistent with Grade I diastolic dysfunction (impaired relaxation). Right Ventricle: The right ventricular size is normal. No increase in right ventricular wall thickness. Right ventricular systolic function is normal. Left Atrium: Left atrial size was normal in size. Right Atrium: Right atrial size was normal in size. Pericardium: There is no evidence of pericardial effusion. Mitral Valve: The mitral valve is normal in structure. Mild mitral valve regurgitation. No evidence of mitral valve stenosis. MV peak gradient, 3.6 mmHg. The mean mitral valve gradient is 1.0 mmHg. Tricuspid  Valve: The tricuspid valve is normal in structure. Tricuspid valve regurgitation is mild . No evidence of tricuspid stenosis. Aortic Valve: The aortic valve is normal in structure. Aortic valve regurgitation is not visualized. No aortic stenosis is present. Aortic valve mean gradient measures 3.5 mmHg. Aortic valve peak gradient measures 5.8 mmHg. Aortic valve area, by VTI measures 3.20 cm. Pulmonic Valve: The pulmonic valve was normal in structure. Pulmonic valve regurgitation is not visualized. No evidence of pulmonic stenosis. Aorta: The aortic root is normal in size and structure. Venous: The inferior vena cava is normal in size with greater than 50% respiratory variability, suggesting right atrial pressure of 3 mmHg. IAS/Shunts: No atrial level shunt detected by color flow Doppler. Additional Comments: 3D was performed not requiring image post processing on an independent workstation and was indeterminate.  LEFT VENTRICLE PLAX 2D LVIDd:         4.60 cm   Diastology LVIDs:         3.20 cm   LV e' medial:    5.22 cm/s LV PW:         1.00 cm   LV E/e' medial:  10.7 LV IVS:        1.30 cm   LV e' lateral:   6.85 cm/s LVOT diam:     2.00 cm   LV E/e' lateral: 8.2 LV SV:         73 LV SV Index:   38 LVOT Area:     3.14 cm  RIGHT VENTRICLE RV Basal diam:  3.40 cm RV Mid diam:    2.60 cm LEFT ATRIUM  Index        RIGHT ATRIUM           Index LA diam:      2.40 cm 1.25 cm/m   RA Area:     10.10 cm LA Vol (A2C): 42.7 ml 22.20 ml/m  RA Volume:   21.70 ml  11.28 ml/m LA Vol (A4C): 9.5 ml  4.93 ml/m  AORTIC VALVE AV Area (Vmax):    2.93 cm AV Area (Vmean):   2.78 cm AV Area (VTI):     3.20 cm AV Vmax:           120.00 cm/s AV Vmean:          85.850 cm/s AV VTI:            0.227 m AV Peak Grad:      5.8 mmHg AV Mean Grad:      3.5 mmHg LVOT Vmax:         112.00 cm/s LVOT Vmean:        75.900 cm/s LVOT VTI:          0.231 m LVOT/AV VTI ratio: 1.02  AORTA Ao Root diam: 3.10 cm MITRAL VALVE MV Area (PHT):  3.06 cm    SHUNTS MV Area VTI:   3.27 cm    Systemic VTI:  0.23 m MV Peak grad:  3.6 mmHg    Systemic Diam: 2.00 cm MV Mean grad:  1.0 mmHg MV Vmax:       0.95 m/s MV Vmean:      40.0 cm/s MV Decel Time: 248 msec MV E velocity: 56.10 cm/s MV A velocity: 87.00 cm/s MV E/A ratio:  0.64 Marsa Dooms MD Electronically signed by Marsa Dooms MD Signature Date/Time: 09/09/2024/12:55:24 PM    Final    DG Chest Portable 1 View Result Date: 09/08/2024 EXAM: 1 VIEW(S) XRAY OF THE CHEST 09/08/2024 12:00:00 PM COMPARISON: None available. CLINICAL HISTORY: Chest pain and SOB. FINDINGS: LUNGS AND PLEURA: No focal pulmonary opacity. No pulmonary edema. No pleural effusion. No pneumothorax. HEART AND MEDIASTINUM: No acute abnormality of the cardiac and mediastinal silhouettes. BONES AND SOFT TISSUES: No acute osseous abnormality. IMPRESSION: 1. No acute cardiopulmonary pathology. Electronically signed by: Ryan Salvage MD 09/08/2024 12:52 PM EDT RP Workstation: [id number]

## 2024-09-24 LAB — KAPPA/LAMBDA LIGHT CHAINS
Kappa free light chain: 18.3 mg/L (ref 3.3–19.4)
Kappa, lambda light chain ratio: 0.97 (ref 0.26–1.65)
Lambda free light chains: 18.8 mg/L (ref 5.7–26.3)

## 2024-09-24 LAB — HAPTOGLOBIN: Haptoglobin: 121 mg/dL (ref 34–355)

## 2024-09-26 LAB — COPPER, SERUM: Copper: 90 ug/dL (ref 69–132)

## 2024-09-28 ENCOUNTER — Ambulatory Visit: Payer: Self-pay | Admitting: Oncology

## 2024-09-28 LAB — MULTIPLE MYELOMA PANEL, SERUM
Albumin SerPl Elph-Mcnc: 4 g/dL (ref 2.9–4.4)
Albumin/Glob SerPl: 1.5 (ref 0.7–1.7)
Alpha 1: 0.3 g/dL (ref 0.0–0.4)
Alpha2 Glob SerPl Elph-Mcnc: 0.6 g/dL (ref 0.4–1.0)
B-Globulin SerPl Elph-Mcnc: 0.9 g/dL (ref 0.7–1.3)
Gamma Glob SerPl Elph-Mcnc: 1 g/dL (ref 0.4–1.8)
Globulin, Total: 2.8 g/dL (ref 2.2–3.9)
IgA: 115 mg/dL (ref 61–437)
IgG (Immunoglobin G), Serum: 777 mg/dL (ref 603–1613)
IgM (Immunoglobulin M), Srm: 197 mg/dL — ABNORMAL HIGH (ref 15–143)
Total Protein ELP: 6.8 g/dL (ref 6.0–8.5)

## 2024-09-29 ENCOUNTER — Other Ambulatory Visit: Payer: Self-pay

## 2024-09-29 DIAGNOSIS — D539 Nutritional anemia, unspecified: Secondary | ICD-10-CM

## 2024-10-01 ENCOUNTER — Inpatient Hospital Stay

## 2024-10-01 DIAGNOSIS — D7589 Other specified diseases of blood and blood-forming organs: Secondary | ICD-10-CM | POA: Diagnosis not present

## 2024-10-07 LAB — HEMOCHROMATOSIS DNA-PCR(C282Y,H63D)

## 2024-10-08 ENCOUNTER — Other Ambulatory Visit: Payer: Self-pay | Admitting: Interventional Radiology

## 2024-10-08 DIAGNOSIS — Z01818 Encounter for other preprocedural examination: Secondary | ICD-10-CM

## 2024-10-08 NOTE — Progress Notes (Signed)
 Patient for IR Bone Marrow Biopsy on Thurs 10/09/24, I called and lvm for the patient on the phone and gave pre-procedure instructions. vm made the pt aware to be here at 7:30a, NPO after MN prior to procedure as well as driver post procedure/recovery/discharge. Pt stated understanding. Called 10/08/24

## 2024-10-08 NOTE — H&P (Signed)
 Chief Complaint: image guided bone marrow biopsy and aspiration for macrocytic anemia  Referring Provider(s): Yu,Zhou  Supervising Physician: Jenna Hacker  Patient Status: [facility] - Out-pt  History of Present Illness: Donald Barker. is a 78 y.o. male with medical history significant for HTN and pre-diabetes. Patient has seen Dr Babara for macrocytic anemia, macrocytosis, and declining hemoglobin since 2022. He sees Dr Babara with hematology/oncology. He experiences shortness of breath and was recently hospitalized 09/08/24-09/09/24 for this issue. He reports occasional mild chest discomfort which he has seen cardiology for with unremarkable results. Received request for image guided bone marrow biopsy and aspiration.   Patient presents today with wife at the bedside. States that he has been doing well overall. Denies any fevers/chills, nausea/vomiting, abdominal pain, chest pain, or shortness of breath. NPO since midnight. All questions and concerns answered at the bedside.   Allergies Reviewed:  Metoprolol tartrate   Patient is Full Code  Past Medical History:  Diagnosis Date   Chronic bronchitis (HCC)    History of adenomatous polyp of colon    Hypertension    Pre-diabetes    Pure hypercholesterolemia     Past Surgical History:  Procedure Laterality Date   BIOPSY  12/31/2023   Procedure: BIOPSY;  Surgeon: Aundria, Ladell POUR, MD;  Location: [facility] ENDOSCOPY;  Service: Gastroenterology;;   CARDIAC CATHETERIZATION     COLONOSCOPY WITH PROPOFOL  N/A 08/11/2018   Procedure: COLONOSCOPY WITH PROPOFOL ;  Surgeon: Viktoria Lamar DASEN, MD;  Location: [facility] ENDOSCOPY;  Service: Endoscopy;  Laterality: N/A;   COLONOSCOPY WITH PROPOFOL  N/A 12/31/2023   Procedure: COLONOSCOPY WITH PROPOFOL ;  Surgeon: Toledo, Ladell POUR, MD;  Location: [facility] ENDOSCOPY;  Service: Gastroenterology;  Laterality: N/A;   KNEE ARTHROSCOPY Right 12/30/2020   Procedure: Right kne arthroscopic partial synovectomy;  Surgeon:  Tobie Priest, MD;  Location: [facility];  Service: Orthopedics;  Laterality: Right;   KNEE ARTHROSCOPY WITH MEDIAL MENISECTOMY Right 07/15/2020   Procedure: Right knee arthroscopic partial medial meniscectomy;  Surgeon: Tobie Priest, MD;  Location: [facility] ORS;  Service: Orthopedics;  Laterality: Right;   LEFT HEART CATH AND CORONARY ANGIOGRAPHY N/A 09/12/2022   Procedure: LEFT HEART CATH AND CORONARY ANGIOGRAPHY;  Surgeon: Hester Wolm PARAS, MD;  Location: [facility] INVASIVE CV LAB;  Service: Cardiovascular;  Laterality: N/A;   POLYPECTOMY  12/31/2023   Procedure: POLYPECTOMY;  Surgeon: Toledo, Teodoro K, MD;  Location: [facility] ENDOSCOPY;  Service: Gastroenterology;;     Medications: Prior to Admission medications   Medication Sig Start Date End Date Taking? Authorizing Provider  albuterol  (VENTOLIN  HFA) 108 (90 Base) MCG/ACT inhaler Inhale 2 puffs into the lungs every 4 (four) hours as needed for wheezing. 08/07/24 08/07/25  [provider]  predniSONE (DELTASONE) 20 MG tablet Take 20 mg by mouth daily as needed.    [provider]     Family History  Problem Relation Age of Onset   Cancer Father     Social History   Socioeconomic History   Marital status: Married    Spouse name: Not on file   Number of children: Not on file   Years of education: Not on file   Highest education level: Not on file  Occupational History   Not on file  Tobacco Use   Smoking status: Former    Current packs/day: 0.00    Types: Cigarettes    Quit date: 08/11/1984    Years since quitting: 40.1   Smokeless tobacco: Never  Vaping Use   Vaping  status: Never Used  Substance and Sexual Activity   Alcohol use: Yes    Alcohol/week: 2.0 standard drinks of alcohol    Types: 1 Cans of beer, 1 Glasses of wine per week   Drug use: Never   Sexual activity: Not on file  Other Topics Concern   Not on file  Social History Narrative   Lives at home with wife   Social Drivers of Health    Financial Resource Strain: Low Risk  (09/23/2024)   Overall Financial Resource Strain (CARDIA)    Difficulty of Paying Living Expenses: Not very hard  Food Insecurity: No Food Insecurity (09/23/2024)   Hunger Vital Sign    Worried About Running Out of Food in the Last Year: Never true    Ran Out of Food in the Last Year: Never true  Transportation Needs: No Transportation Needs (09/23/2024)   PRAPARE - Administrator, Civil Service (Medical): No    Lack of Transportation (Non-Medical): No  Physical Activity: Not on file  Stress: No Stress Concern Present (09/23/2024)   [employer] of Occupational Health - Occupational Stress Questionnaire    Feeling of Stress: Not at all  Social Connections: Not on file    Review of Systems: A 12 point ROS discussed and pertinent positives are indicated in the HPI above.  All other systems are negative.   Vital Signs: BP (!) 147/63   Pulse 62   Temp 98 F (36.7 C) (Oral)   Resp 15   SpO2 97%    Physical Exam Vitals reviewed.  Constitutional:      Appearance: Normal appearance.  HENT:     Mouth/Throat:     Mouth: Mucous membranes are moist.     Pharynx: Oropharynx is clear.  Cardiovascular:     Rate and Rhythm: Normal rate and regular rhythm.     Heart sounds: Normal heart sounds.  Pulmonary:     Effort: Pulmonary effort is normal.     Breath sounds: Normal breath sounds.  Abdominal:     General: Abdomen is flat.     Palpations: Abdomen is soft.     Tenderness: There is no abdominal tenderness.  Skin:    General: Skin is warm and dry.  Neurological:     Mental Status: He is alert and oriented to person, place, and time.  Psychiatric:        Behavior: Behavior normal.     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 The study is low risk.   ST depression was noted.   Left ventricular function is normal. Nuclear stress EF: 75%. The  left ventricular ejection fraction is normal (55-65%). End diastolic cavity size is normal. End systolic cavity size is normal. Mild fixed apical defect with reverse redistribution consistent with mild scar without ischemia Normal LV function Low risk study   ECHOCARDIOGRAM COMPLETE Result Date: 09/09/2024    ECHOCARDIOGRAM REPORT   Patient Name:   Donald Barker. Date of Exam: 09/09/2024 Medical Rec #:  [id number]          Height:       70.0 in Accession #:    [id number]         Weight:       165.0 lb Date of Birth:  08/20/1946          BSA:          1.923 m Patient Age:  78 years           BP:           119/45 mmHg Patient Gender: M                  HR:           78 bpm. Exam Location:  [facility] Procedure: 2D Echo, Cardiac Doppler and Color Doppler (Both Spectral and Color            Flow Doppler were utilized during procedure). Indications:     Chest pain R07.9  History:         Patient has no prior history of Echocardiogram examinations.                  Risk Factors:Hypertension.  Sonographer:     Christopher Furnace Referring Phys:  [id number] CORT ONEIDA MANA Diagnosing Phys: Marsa Dooms MD IMPRESSIONS  1. Left ventricular ejection fraction, by estimation, is 55 to 60%. The left ventricle has normal function. The left ventricle has no regional wall motion abnormalities. Left ventricular diastolic parameters are consistent with Grade I diastolic dysfunction (impaired relaxation).  2. Right ventricular systolic function is normal. The right ventricular size is normal.  3. The mitral valve is normal in structure. Mild mitral valve regurgitation. No evidence of mitral stenosis.  4. The aortic valve is normal in structure. Aortic valve regurgitation is not visualized. No aortic stenosis is present.  5. The inferior vena cava is normal in size with greater than 50% respiratory variability, suggesting right atrial pressure of 3 mmHg. FINDINGS  Left Ventricle: Left ventricular ejection fraction, by estimation, is 55 to  60%. The left ventricle has normal function. The left ventricle has no regional wall motion abnormalities. Strain was performed and the global longitudinal strain is indeterminate. The left ventricular internal cavity size was normal in size. There is no left ventricular hypertrophy. Left ventricular diastolic parameters are consistent with Grade I diastolic dysfunction (impaired relaxation). Right Ventricle: The right ventricular size is normal. No increase in right ventricular wall thickness. Right ventricular systolic function is normal. Left Atrium: Left atrial size was normal in size. Right Atrium: Right atrial size was normal in size. Pericardium: There is no evidence of pericardial effusion. Mitral Valve: The mitral valve is normal in structure. Mild mitral valve regurgitation. No evidence of mitral valve stenosis. MV peak gradient, 3.6 mmHg. The mean mitral valve gradient is 1.0 mmHg. Tricuspid Valve: The tricuspid valve is normal in structure. Tricuspid valve regurgitation is mild . No evidence of tricuspid stenosis. Aortic Valve: The aortic valve is normal in structure. Aortic valve regurgitation is not visualized. No aortic stenosis is present. Aortic valve mean gradient measures 3.5 mmHg. Aortic valve peak gradient measures 5.8 mmHg. Aortic valve area, by VTI measures 3.20 cm. Pulmonic Valve: The pulmonic valve was normal in structure. Pulmonic valve regurgitation is not visualized. No evidence of pulmonic stenosis. Aorta: The aortic root is normal in size and structure. Venous: The inferior vena cava is normal in size with greater than 50% respiratory variability, suggesting right atrial pressure of 3 mmHg. IAS/Shunts: No atrial level shunt detected by color flow Doppler. Additional Comments: 3D was performed not requiring image post processing on an independent workstation and was indeterminate.  LEFT VENTRICLE PLAX 2D LVIDd:         4.60 cm   Diastology LVIDs:         3.20 cm   LV e' medial:    5.22  cm/s LV PW:         1.00 cm   LV E/e' medial:  10.7 LV IVS:        1.30 cm   LV e' lateral:   6.85 cm/s LVOT diam:     2.00 cm   LV E/e' lateral: 8.2 LV SV:         73 LV SV Index:   38 LVOT Area:     3.14 cm  RIGHT VENTRICLE RV Basal diam:  3.40 cm RV Mid diam:    2.60 cm LEFT ATRIUM           Index        RIGHT ATRIUM           Index LA diam:      2.40 cm 1.25 cm/m   RA Area:     10.10 cm LA Vol (A2C): 42.7 ml 22.20 ml/m  RA Volume:   21.70 ml  11.28 ml/m LA Vol (A4C): 9.5 ml  4.93 ml/m  AORTIC VALVE AV Area (Vmax):    2.93 cm AV Area (Vmean):   2.78 cm AV Area (VTI):     3.20 cm AV Vmax:           120.00 cm/s AV Vmean:          85.850 cm/s AV VTI:            0.227 m AV Peak Grad:      5.8 mmHg AV Mean Grad:      3.5 mmHg LVOT Vmax:         112.00 cm/s LVOT Vmean:        75.900 cm/s LVOT VTI:          0.231 m LVOT/AV VTI ratio: 1.02  AORTA Ao Root diam: 3.10 cm MITRAL VALVE MV Area (PHT): 3.06 cm    SHUNTS MV Area VTI:   3.27 cm    Systemic VTI:  0.23 m MV Peak grad:  3.6 mmHg    Systemic Diam: 2.00 cm MV Mean grad:  1.0 mmHg MV Vmax:       0.95 m/s MV Vmean:      40.0 cm/s MV Decel Time: 248 msec MV E velocity: 56.10 cm/s MV A velocity: 87.00 cm/s MV E/A ratio:  0.64 Marsa Dooms MD Electronically signed by Marsa Dooms MD Signature Date/Time: 09/09/2024/12:55:24 PM    Final     Labs:  CBC: Recent Labs    09/08/24 1136 09/09/24 0639 09/23/24 1202 10/09/24 0748  WBC 4.8 4.0  4.0 4.3 4.2  HGB 10.8* 9.4*  9.5* 10.6* 10.0*  HCT 31.4* 27.7*  27.1* 30.1* 29.8*  PLT 233 179  187 238 203    COAGS: Recent Labs    09/08/24 1318  INR 1.0  APTT 33    BMP: Recent Labs    09/08/24 1136 09/09/24 0639 09/23/24 1202  NA 136 138 136  K 3.6 3.6 4.0  CL 102 103 101  CO2 22 27 26   GLUCOSE 161* 100* 94  BUN 25* 24* 21  CALCIUM 9.3 8.7* 9.3  CREATININE 1.21 0.99 0.98  GFRNONAA >60 >60 >60    LIVER FUNCTION TESTS: Recent Labs    09/23/24 1202  BILITOT 1.4*  AST  19  ALT 16  ALKPHOS 60  PROT 7.3  ALBUMIN 4.9    TUMOR MARKERS: No results for input(s): AFPTM, CEA, CA199, CHROMGRNA in the last 8760 hours.  Assessment and Plan: Macrocytic anemia-  Request for  image guided bone marrow biopsy and aspiration.  No contraindications for procedure identified in ROS, physical exam, or review of pre-sedation considerations.  Labs reviewed and within acceptable range Imaging available and reviewed by Dr Jenna VSS, afebrile Patient does not take a blood thinner. Patient not asked to hold any AC/AP for this low bleeding risk procedure Abx not indicated    Risks and benefits of image guided bone marrow biopsy was discussed with the patient and/or patient's family including, but not limited to bleeding, infection, damage to adjacent structures or low yield requiring additional tests.  All of the questions were answered and there is agreement to proceed.  Consent signed and in chart.   Thank you for allowing our service to participate in Donald Barker. 's care.    Electronically Signed: Glennon CHRISTELLA Bal, PA-C   10/09/2024, 8:30 AM     I spent a total of 15 Minutes  in face to face in clinical consultation, greater than 50% of which was counseling/coordinating care for image guided bone marrow biopsy and aspiration.

## 2024-10-09 ENCOUNTER — Other Ambulatory Visit: Payer: Self-pay

## 2024-10-09 ENCOUNTER — Ambulatory Visit
Admission: RE | Admit: 2024-10-09 | Discharge: 2024-10-09 | Disposition: A | Discharge status: Home / Self Care | Source: Ambulatory Visit | Attending: Oncology | Admitting: Oncology

## 2024-10-09 ENCOUNTER — Encounter: Payer: Self-pay | Admitting: Radiology

## 2024-10-09 DIAGNOSIS — D539 Nutritional anemia, unspecified: Secondary | ICD-10-CM | POA: Diagnosis present

## 2024-10-09 DIAGNOSIS — R7303 Prediabetes: Secondary | ICD-10-CM | POA: Diagnosis not present

## 2024-10-09 DIAGNOSIS — Z87891 Personal history of nicotine dependence: Secondary | ICD-10-CM | POA: Diagnosis not present

## 2024-10-09 DIAGNOSIS — I10 Essential (primary) hypertension: Secondary | ICD-10-CM | POA: Insufficient documentation

## 2024-10-09 DIAGNOSIS — Z1379 Encounter for other screening for genetic and chromosomal anomalies: Secondary | ICD-10-CM | POA: Insufficient documentation

## 2024-10-09 DIAGNOSIS — Z01818 Encounter for other preprocedural examination: Secondary | ICD-10-CM

## 2024-10-09 HISTORY — PX: IR BONE MARROW BIOPSY & ASPIRATION: IMG5727

## 2024-10-09 LAB — CBC WITH DIFFERENTIAL/PLATELET
Abs Immature Granulocytes: 0.02 K/uL (ref 0.00–0.07)
Basophils Absolute: 0.1 K/uL (ref 0.0–0.1)
Basophils Relative: 1 %
Eosinophils Absolute: 0.1 K/uL (ref 0.0–0.5)
Eosinophils Relative: 1 %
HCT: 29.8 % — ABNORMAL LOW (ref 39.0–52.0)
Hemoglobin: 10 g/dL — ABNORMAL LOW (ref 13.0–17.0)
Immature Granulocytes: 1 %
Lymphocytes Relative: 39 %
Lymphs Abs: 1.6 K/uL (ref 0.7–4.0)
MCH: 35.3 pg — ABNORMAL HIGH (ref 26.0–34.0)
MCHC: 33.6 g/dL (ref 30.0–36.0)
MCV: 105.3 fL — ABNORMAL HIGH (ref 80.0–100.0)
Monocytes Absolute: 0.5 K/uL (ref 0.1–1.0)
Monocytes Relative: 12 %
Neutro Abs: 1.9 K/uL (ref 1.7–7.7)
Neutrophils Relative %: 46 %
Platelets: 203 K/uL (ref 150–400)
RBC: 2.83 MIL/uL — ABNORMAL LOW (ref 4.22–5.81)
RDW: 18.1 % — ABNORMAL HIGH (ref 11.5–15.5)
WBC: 4.2 K/uL (ref 4.0–10.5)
nRBC: 0.7 % — ABNORMAL HIGH (ref 0.0–0.2)

## 2024-10-09 MED ORDER — FENTANYL CITRATE (PF) 100 MCG/2ML IJ SOLN
INTRAMUSCULAR | Status: AC | PRN
  Administered 2024-10-09: 50 ug via INTRAVENOUS

## 2024-10-09 MED ORDER — LIDOCAINE 1 % OPTIME INJ - NO CHARGE
5.0000 mL | Freq: Once | INTRAMUSCULAR | Status: AC
  Administered 2024-10-09: 5 mL via INTRADERMAL
  Filled 2024-10-09: qty 6

## 2024-10-09 MED ORDER — MIDAZOLAM HCL 5 MG/5ML IJ SOLN
INTRAMUSCULAR | Status: AC | PRN
  Administered 2024-10-09: 1 mg via INTRAVENOUS

## 2024-10-09 MED ORDER — MIDAZOLAM HCL 2 MG/2ML IJ SOLN
INTRAMUSCULAR | Status: AC
Start: 2024-10-09 — End: 2024-10-09
  Filled 2024-10-09: qty 2

## 2024-10-09 MED ORDER — SODIUM CHLORIDE 0.9 % IV SOLN: INTRAVENOUS | Status: DC

## 2024-10-09 MED ORDER — FENTANYL CITRATE (PF) 100 MCG/2ML IJ SOLN
INTRAMUSCULAR | Status: AC
  Filled 2024-10-09: qty 2

## 2024-10-09 MED ORDER — HEPARIN SOD (PORK) LOCK FLUSH 100 UNIT/ML IV SOLN
INTRAVENOUS | Status: AC
  Filled 2024-10-09: qty 5

## 2024-10-09 NOTE — Discharge Instructions (Signed)
 Bone Marrow Aspiration and Bone Marrow Biopsy, Adult, Care After This sheet gives you information about how to care for yourself after your procedure. If you have problems or questions, contact your health care provider.  What can I expect after the procedure?  After the procedure, it is common to have: Mild pain and tenderness. Swelling. Bruising.  Follow these instructions at home: Take over-the-counter or prescription medicines only as told by your health care provider. You may shower tomorrow Remove band aid tomorrow, replace with another bandaid if  site has any drainage from biopsy site. Wash your hands with soap and water before you touch your biopsy site  If soap and water are not available, use hand sanitizer. Change your dressing frequently for bleeding and/or drainage. Check your puncture site every day for signs of infection. Check for: More redness, swelling, or pain. More fluid or blood. Warmth. Pus or a bad smell. Return to your normal activities in 24hours.  Do not drive for 24 hours if you were given a medicine to help you relax (sedative). Keep all follow-up visits as told by your health care provider. This is important. Contact a health care provider if: You have more redness, swelling, or pain around the puncture site. You have more fluid or blood coming from the puncture site. Your puncture site feels warm to the touch. You have pus or a bad smell coming from the puncture site. You have a fever. Your pain is not controlled with medicine. This information is not intended to replace advice given to you by your health care provider. Make sure you discuss any questions you have with your health care provider. Document Released: 06/08/2005 Document Revised: 06/08/2016 Document Reviewed: 05/02/2016 Elsevier Interactive Patient Education  2018 [employer].

## 2024-10-09 NOTE — Procedures (Signed)
 Interventional Radiology Procedure Note  Procedure: Fluoro  Guided Biopsy of bone marrow  Complications: None  Estimated Blood Loss: < 10 mL  Findings: 13 G core biopsy of left iliac bone performed under fluoro guidance.  Aspirate and core samples obtained and sent to Pathology.  Cordella DELENA Banner, MD

## 2024-10-13 LAB — SURGICAL PATHOLOGY

## 2024-10-19 ENCOUNTER — Encounter (HOSPITAL_COMMUNITY): Payer: Self-pay | Admitting: Oncology

## 2024-10-23 ENCOUNTER — Inpatient Hospital Stay: Attending: Oncology | Admitting: Oncology

## 2024-10-23 ENCOUNTER — Encounter: Payer: Self-pay | Admitting: Oncology

## 2024-10-23 VITALS — BP 135/64 | HR 61 | Temp 96.5°F | Resp 18 | Wt 167.3 lb

## 2024-10-23 DIAGNOSIS — Z148 Genetic carrier of other disease: Secondary | ICD-10-CM | POA: Diagnosis not present

## 2024-10-23 DIAGNOSIS — D539 Nutritional anemia, unspecified: Secondary | ICD-10-CM

## 2024-10-23 NOTE — Assessment & Plan Note (Addendum)
 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 Vitamin B12 and folate levels normal. No M protein on SPEP, adequate B12 and folate. Normal LDH copper  haptoglobin  Bone marrow biopsy showed - Hypercellular bone marrow (60%) with erythroid predominant trilineage hematopoiesis and mild dyspoiesis.  Normal cytogenetics. NGS is pending.  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 Pending NGS.  Recommend observation and monitor counts.

## 2024-10-23 NOTE — Assessment & Plan Note (Addendum)
 Diagnosis of hereditary hemochromatosis discussed with patient.  Advised patient to have first-degree relative screening for hemochromatosis.  Avoid alcohol consumption.  Avoid iron and vitamin C supplementation  Patient voices understanding.    Elevated ferritin - iron overload vs chronic inflammation/liver disease.  Obtain US  RUQ 2D echocardiogram revealed normal left ventricular function, with LVEF  55%-60%

## 2024-10-23 NOTE — Progress Notes (Signed)
 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 Vitamin B12 and folate levels normal. No M protein on SPEP, adequate B12 and folate. Normal LDH copper  haptoglobin  Bone marrow biopsy showed - Hypercellular bone marrow (60%) with erythroid predominant trilineage hematopoiesis and mild dyspoiesis.  Normal cytogenetics. NGS is pending.  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 Pending NGS.  Recommend observation and monitor counts.   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  Advised patient to have first-degree relative screening for hemochromatosis.  Avoid alcohol consumption.  Avoid iron and vitamin C supplementation  Patient voices understanding.    Elevated ferritin - iron overload vs chronic inflammation/liver disease.  Obtain US  RUQ 2D echocardiogram revealed normal left ventricular function, with LVEF  55%-60%    Orders Placed This Encounter  Procedures   US  ABDOMEN LIMITED RUQ (LIVER/GB)    Standing Status:   Future    Expected Date:   10/30/2024    Expiration Date:   10/23/2025    Reason for Exam (SYMPTOM  OR DIAGNOSIS REQUIRED):   hemochromatosis carrier, high ferritin    Preferred imaging location?:   [facility]   CBC with Differential (Cancer Center Only)    Standing Status:   Future    Expected Date:   01/23/2025    Expiration Date:   04/23/2025   CMP (Cancer Center only)    Standing Status:   Future    Expected Date:   01/23/2025    Expiration Date:   04/23/2025   Iron and TIBC    Standing Status:   Future    Expected Date:   01/23/2025    Expiration Date:   04/23/2025   Ferritin    Standing Status:   Future    Expected Date:    01/23/2025    Expiration Date:   04/23/2025   Follow up in 3 months or earlier if indicated.   All questions were answered. The patient knows to call the clinic with any problems, questions or concerns.  Zelphia Cap, MD, PhD [facility] Hematology Oncology 10/23/2024   HISTORY OF PRESENTING ILLNESS:   Donald Sopp. is a  78 y.o.  male with PMH listed below was seen in consultation at the request of  Lenon Layman ORN, MD  for evaluation of macrocytic anemia.   Discussed the use of AI scribe software for clinical note transcription with the patient, who gave verbal consent to proceed.   He has experienced a decrease in hemoglobin levels, with the most recent test on October 8th showing a hemoglobin level of 9.4 g/dL. His previous hemoglobin count in 2023 was 12.9 g/dL. He has not had regular CBC testing in recent years  He experiences shortness of breath and was recently hospitalized due to this issue. He underwent a stress test and has seen a cardiologist, who did not find any significant issues. No major chest pain, but he mentions occasional mild chest discomfort.  He reports low energy levels, stating he 'pretty much wakes up tired'. He has a history of mouth sores for which he takes prednisone as needed, typically two or three doses to resolve the ulcers.  His father died of colon cancer. He has undergone colonoscopy screening,  Socially, he drinks alcohol  occasionally and is a former smoker. He does not currently smoke.   INTERVAL HISTORY Donald Barker. is a 78 y.o. male who has above history reviewed by me today presents for follow up visit for macrocytic anemia.  Patient had blood work up and bone marrow biopsy done. Presents to discuss results.  He continues to feel fatigued.    MEDICAL HISTORY:  Past Medical History:  Diagnosis Date   Chronic bronchitis (HCC)    History of adenomatous polyp of colon    Hypertension    Pre-diabetes    Pure hypercholesterolemia      SURGICAL HISTORY: Past Surgical History:  Procedure Laterality Date   BIOPSY  12/31/2023   Procedure: BIOPSY;  Surgeon: Aundria, Ladell POUR, MD;  Location: [facility] ENDOSCOPY;  Service: Gastroenterology;;   CARDIAC CATHETERIZATION     COLONOSCOPY WITH PROPOFOL  N/A 08/11/2018   Procedure: COLONOSCOPY WITH PROPOFOL ;  Surgeon: Viktoria Lamar DASEN, MD;  Location: [facility] ENDOSCOPY;  Service: Endoscopy;  Laterality: N/A;   COLONOSCOPY WITH PROPOFOL  N/A 12/31/2023   Procedure: COLONOSCOPY WITH PROPOFOL ;  Surgeon: Toledo, Ladell POUR, MD;  Location: [facility] ENDOSCOPY;  Service: Gastroenterology;  Laterality: N/A;   IR BONE MARROW BIOPSY & ASPIRATION  10/09/2024   KNEE ARTHROSCOPY Right 12/30/2020   Procedure: Right kne arthroscopic partial synovectomy;  Surgeon: Tobie Priest, MD;  Location: [facility];  Service: Orthopedics;  Laterality: Right;   KNEE ARTHROSCOPY WITH MEDIAL MENISECTOMY Right 07/15/2020   Procedure: Right knee arthroscopic partial medial meniscectomy;  Surgeon: Tobie Priest, MD;  Location: [facility] ORS;  Service: Orthopedics;  Laterality: Right;   LEFT HEART CATH AND CORONARY ANGIOGRAPHY N/A 09/12/2022   Procedure: LEFT HEART CATH AND CORONARY ANGIOGRAPHY;  Surgeon: Hester Wolm PARAS, MD;  Location: [facility] INVASIVE CV LAB;  Service: Cardiovascular;  Laterality: N/A;   POLYPECTOMY  12/31/2023   Procedure: POLYPECTOMY;  Surgeon: Toledo, Ladell POUR, MD;  Location: [facility] ENDOSCOPY;  Service: Gastroenterology;;    SOCIAL HISTORY: Social History   Socioeconomic History   Marital status: Married    Spouse name: Not on file   Number of children: Not on file   Years of education: Not on file   Highest education level: Not on file  Occupational History   Not on file  Tobacco Use   Smoking status: Former    Current packs/day: 0.00    Types: Cigarettes    Quit date: 08/11/1984    Years since quitting: 40.2   Smokeless tobacco: Never  Vaping Use   Vaping status: Never Used  Substance and  Sexual Activity   Alcohol use: Yes    Alcohol/week: 2.0 standard drinks of alcohol    Types: 1 Cans of beer, 1 Glasses of wine per week   Drug use: Never   Sexual activity: Not on file  Other Topics Concern   Not on file  Social History Narrative   Lives at home with wife   Social Drivers of Health   Financial Resource Strain: Low Risk  (09/23/2024)   Overall Financial Resource Strain (CARDIA)    Difficulty of Paying Living Expenses: Not very hard  Food Insecurity: No Food Insecurity (09/23/2024)   Hunger Vital Sign    Worried About Running Out of Food in the Last Year: Never true    Ran Out of Food in the Last Year: Never true  Transportation Needs: No Transportation Needs (09/23/2024)   PRAPARE - Administrator, Civil Service (Medical): No    Lack of Transportation (Non-Medical):  No  Physical Activity: Not on file  Stress: No Stress Concern Present (09/23/2024)   [employer] of Occupational Health - Occupational Stress Questionnaire    Feeling of Stress: Not at all  Social Connections: Not on file  Intimate Partner Violence: Not At Risk (09/23/2024)   Humiliation, Afraid, Rape, and Kick questionnaire    Fear of Current or Ex-Partner: No    Emotionally Abused: No    Physically Abused: No    Sexually Abused: No    FAMILY HISTORY: Family History  Problem Relation Age of Onset   Cancer Father     ALLERGIES:  is allergic to metoprolol tartrate.  MEDICATIONS:  Current Outpatient Medications  Medication Sig Dispense Refill   albuterol  (VENTOLIN  HFA) 108 (90 Base) MCG/ACT inhaler Inhale 2 puffs into the lungs every 4 (four) hours as needed for wheezing.     predniSONE (DELTASONE) 20 MG tablet Take 20 mg by mouth daily as needed.     No current facility-administered medications for this visit.    Review of Systems  Constitutional:  Positive for fatigue. Negative for appetite change, chills, fever and unexpected weight change.  HENT:   Negative for  hearing loss and voice change.   Eyes:  Negative for eye problems and icterus.  Respiratory:  Positive for shortness of breath. Negative for chest tightness and cough.   Cardiovascular:  Negative for chest pain and leg swelling.  Gastrointestinal:  Negative for abdominal distention and abdominal pain.  Endocrine: Negative for hot flashes.  Genitourinary:  Negative for difficulty urinating, dysuria and frequency.   Musculoskeletal:  Negative for arthralgias.  Skin:  Negative for itching and rash.  Neurological:  Negative for light-headedness and numbness.  Hematological:  Negative for adenopathy. Does not bruise/bleed easily.  Psychiatric/Behavioral:  Negative for confusion.    PHYSICAL EXAMINATION:  Vitals:   10/23/24 1015  BP: 135/64  Pulse: 61  Resp: 18  Temp: (!) 96.5 F (35.8 C)  SpO2: 98%   Filed Weights   10/23/24 1015  Weight: 167 lb 4.8 oz (75.9 kg)    Physical Exam Constitutional:      General: He is not in acute distress. HENT:     Head: Normocephalic and atraumatic.  Eyes:     General: No scleral icterus. Cardiovascular:     Rate and Rhythm: Normal rate.  Pulmonary:     Effort: Pulmonary effort is normal. No respiratory distress.  Abdominal:     General: There is no distension.  Musculoskeletal:        General: No deformity.     Cervical back: Normal range of motion and neck supple.  Skin:    Findings: No erythema or rash.  Neurological:     Mental Status: He is alert and oriented to person, place, and time. Mental status is at baseline.  Psychiatric:        Mood and Affect: Mood normal.     LABORATORY DATA:  I have reviewed the data as listed    Latest Ref Rng & Units 10/09/2024    7:48 AM 09/23/2024   12:02 PM 09/09/2024    6:39 AM  CBC  WBC 4.0 - 10.5 K/uL 4.2  4.3  4.0    4.0   Hemoglobin 13.0 - 17.0 g/dL 89.9  89.3  9.5    9.4   Hematocrit 39.0 - 52.0 % 29.8  30.1  27.1    27.7   Platelets 150 - 400 K/uL 203  238  187  179        Latest Ref Rng & Units 09/23/2024   12:02 PM 09/09/2024    6:39 AM 09/08/2024   11:36 AM  CMP  Glucose 70 - 99 mg/dL 94  899  838   BUN 8 - 23 mg/dL 21  24  25    Creatinine 0.61 - 1.24 mg/dL 9.01  9.00  8.78   Sodium 135 - 145 mmol/L 136  138  136   Potassium 3.5 - 5.1 mmol/L 4.0  3.6  3.6   Chloride 98 - 111 mmol/L 101  103  102   CO2 22 - 32 mmol/L 26  27  22    Calcium 8.9 - 10.3 mg/dL 9.3  8.7  9.3   Total Protein 6.5 - 8.1 g/dL 7.3     Total Bilirubin 0.0 - 1.2 mg/dL 1.4     Alkaline Phos 38 - 126 U/L 60     AST 15 - 41 U/L 19     ALT 0 - 44 U/L 16         RADIOGRAPHIC STUDIES: I have personally reviewed the radiological images as listed and agreed with the findings in the report. IR BONE MARROW BIOPSY & ASPIRATION Result Date: 10/09/2024 CLINICAL DATA:  Macrocytic anemia. EXAM: Fluoro guided bone marrow biopsy TECHNIQUE: Fluoroscopy of pelvis CONTRAST:  None RADIOPHARMACEUTICALS:  None FLUOROSCOPY: 3.2  MGy COMPARISON:  None FINDINGS: The patient was placed in prone position on the IR table. Radiopaque markers were placed on the patient's skin and initial imaging of the pelvis was performed. The patient's skin was then prepped and draped in the usual sterile fashion. Moderate sedation was provided for by the nursing staff under my supervision utilizing intravenous Versed  and fentanyl . The nurse had no other duties other than monitoring the patient and providing sedation during the procedure. I was present for the entire procedure. 1 mg intravenous Versed  and 50 mcg intravenous fentanyl  were administered for a total sedation time of 10 minutes. 1% lidocaine  was used to infiltrate the skin at the access site prior to a stab incision. Local anesthesia was then used to infiltrate the region of soft tissue from the skin to the left iliac bone. The bone marrow needle was then advanced and imaging demonstrated the needle tip to be in the cortex of the left iliac bone. The bone was then  penetrated and a sample was obtained. After the sample was evaluated, approximately 5 mL of heparinized bone marrow sample was obtained by aspiration. A core sample was then obtained. Multiple attempts at sampling was performed in order to get 2 1 cm segments. All needles were then removed from the patient. Sterile dressing was applied. IMPRESSION: Satisfactory core needle biopsy and aspiration of the left iliac bone marrow under fluoroscopy guidance. 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 The study is low risk.   ST depression was noted.   Left ventricular function is normal. Nuclear stress EF: 75%. The left ventricular ejection fraction is normal (55-65%). End diastolic cavity size is normal. End systolic cavity size is normal. Mild fixed apical defect with reverse redistribution consistent with mild scar without ischemia Normal LV function Low risk study   ECHOCARDIOGRAM COMPLETE Result Date: 09/09/2024    ECHOCARDIOGRAM REPORT   Patient Name:   Donald Barker. Date of Exam: 09/09/2024 Medical Rec #:  [id number]          Height:       70.0 in Accession #:    [id number]         Weight:       165.0 lb Date of Birth:  1946/10/30          BSA:          1.923 m Patient Age:    78 years           BP:           119/45 mmHg Patient Gender: M                  HR:           78 bpm. Exam Location:  [facility] Procedure: 2D Echo, Cardiac Doppler and Color Doppler (Both Spectral and Color            Flow Doppler were utilized during procedure). Indications:     Chest pain R07.9  History:         Patient has no prior history of Echocardiogram examinations.                  Risk Factors:Hypertension.  Sonographer:     Christopher Furnace Referring Phys:  [id number] CORT ONEIDA MANA Diagnosing Phys: Marsa Dooms MD IMPRESSIONS  1. Left ventricular ejection fraction, by estimation, is 55 to 60%. The  left ventricle has normal function. The left ventricle has no regional wall motion abnormalities. Left ventricular diastolic parameters are consistent with Grade I diastolic dysfunction (impaired relaxation).  2. Right ventricular systolic function is normal. The right ventricular size is normal.  3. The mitral valve is normal in structure. Mild mitral valve regurgitation. No evidence of mitral stenosis.  4. The aortic valve is normal in structure. Aortic valve regurgitation is not visualized. No aortic stenosis is present.  5. The inferior vena cava is normal in size with greater than 50% respiratory variability, suggesting right atrial pressure of 3 mmHg. FINDINGS  Left Ventricle: Left ventricular ejection fraction, by estimation, is 55 to 60%. The left ventricle has normal function. The left ventricle has no regional wall motion abnormalities. Strain was performed and the global longitudinal strain is indeterminate. The left ventricular internal cavity size was normal in size. There is no left ventricular hypertrophy. Left ventricular diastolic parameters are consistent with Grade I diastolic dysfunction (impaired relaxation). Right Ventricle: The right ventricular size is normal. No increase in right ventricular wall thickness. Right ventricular systolic function is normal. Left Atrium: Left atrial size was normal in size. Right Atrium: Right atrial size was normal in size. Pericardium: There is no evidence of pericardial effusion. Mitral Valve: The mitral valve is normal in structure. Mild mitral valve regurgitation. No evidence of mitral valve stenosis. MV peak gradient, 3.6 mmHg. The mean mitral valve gradient is 1.0 mmHg. Tricuspid Valve: The tricuspid valve is normal in structure. Tricuspid valve regurgitation is mild . No evidence of tricuspid stenosis. Aortic Valve: The aortic valve is normal in structure. Aortic valve regurgitation is not visualized. No aortic stenosis is present. Aortic valve mean  gradient measures 3.5 mmHg. Aortic valve peak gradient measures 5.8 mmHg. Aortic valve area, by VTI measures 3.20 cm. Pulmonic Valve: The pulmonic valve was normal in structure. Pulmonic valve regurgitation is not visualized. No evidence of pulmonic stenosis. Aorta: The aortic root is normal in size and structure. Venous: The inferior vena cava is normal in size with greater than 50% respiratory  variability, suggesting right atrial pressure of 3 mmHg. IAS/Shunts: No atrial level shunt detected by color flow Doppler. Additional Comments: 3D was performed not requiring image post processing on an independent workstation and was indeterminate.  LEFT VENTRICLE PLAX 2D LVIDd:         4.60 cm   Diastology LVIDs:         3.20 cm   LV e' medial:    5.22 cm/s LV PW:         1.00 cm   LV E/e' medial:  10.7 LV IVS:        1.30 cm   LV e' lateral:   6.85 cm/s LVOT diam:     2.00 cm   LV E/e' lateral: 8.2 LV SV:         73 LV SV Index:   38 LVOT Area:     3.14 cm  RIGHT VENTRICLE RV Basal diam:  3.40 cm RV Mid diam:    2.60 cm LEFT ATRIUM           Index        RIGHT ATRIUM           Index LA diam:      2.40 cm 1.25 cm/m   RA Area:     10.10 cm LA Vol (A2C): 42.7 ml 22.20 ml/m  RA Volume:   21.70 ml  11.28 ml/m LA Vol (A4C): 9.5 ml  4.93 ml/m  AORTIC VALVE AV Area (Vmax):    2.93 cm AV Area (Vmean):   2.78 cm AV Area (VTI):     3.20 cm AV Vmax:           120.00 cm/s AV Vmean:          85.850 cm/s AV VTI:            0.227 m AV Peak Grad:      5.8 mmHg AV Mean Grad:      3.5 mmHg LVOT Vmax:         112.00 cm/s LVOT Vmean:        75.900 cm/s LVOT VTI:          0.231 m LVOT/AV VTI ratio: 1.02  AORTA Ao Root diam: 3.10 cm MITRAL VALVE MV Area (PHT): 3.06 cm    SHUNTS MV Area VTI:   3.27 cm    Systemic VTI:  0.23 m MV Peak grad:  3.6 mmHg    Systemic Diam: 2.00 cm MV Mean grad:  1.0 mmHg MV Vmax:       0.95 m/s MV Vmean:      40.0 cm/s MV Decel Time: 248 msec MV E velocity: 56.10 cm/s MV A velocity: 87.00 cm/s MV E/A  ratio:  0.64 Marsa Dooms MD Electronically signed by Marsa Dooms MD Signature Date/Time: 09/09/2024/12:55:24 PM    Final    DG Chest Portable 1 View Result Date: 09/08/2024 EXAM: 1 VIEW(S) XRAY OF THE CHEST 09/08/2024 12:00:00 PM COMPARISON: None available. CLINICAL HISTORY: Chest pain and SOB. FINDINGS: LUNGS AND PLEURA: No focal pulmonary opacity. No pulmonary edema. No pleural effusion. No pneumothorax. HEART AND MEDIASTINUM: No acute abnormality of the cardiac and mediastinal silhouettes. BONES AND SOFT TISSUES: No acute osseous abnormality. IMPRESSION: 1. No acute cardiopulmonary pathology. Electronically signed by: Ryan Salvage MD 09/08/2024 12:52 PM EDT RP Workstation: [id number]

## 2024-10-27 ENCOUNTER — Encounter (HOSPITAL_COMMUNITY): Payer: Self-pay

## 2024-10-30 ENCOUNTER — Ambulatory Visit
Admission: RE | Admit: 2024-10-30 | Discharge: 2024-10-30 | Disposition: A | Discharge status: Home / Self Care | Source: Ambulatory Visit | Attending: Oncology | Admitting: Oncology

## 2024-10-30 DIAGNOSIS — D539 Nutritional anemia, unspecified: Secondary | ICD-10-CM | POA: Insufficient documentation

## 2025-01-14 ENCOUNTER — Inpatient Hospital Stay

## 2025-01-19 ENCOUNTER — Inpatient Hospital Stay: Admitting: Oncology
